# Patient Record
Sex: Female | Born: 2000 | Race: Black or African American | Hispanic: No | Marital: Single | Smoking: Never smoker
Health system: Southern US, Community
[De-identification: ages and names within clinical notes are randomized; demographics above are authoritative.]

## PROBLEM LIST (undated history)

## (undated) DIAGNOSIS — J45909 Unspecified asthma, uncomplicated: Secondary | ICD-10-CM

## (undated) HISTORY — DX: Unspecified asthma, uncomplicated: J45.909

---
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
## 2019-09-23 LAB — OB RESULTS CONSOLE HEPATITIS B SURFACE ANTIGEN: Hepatitis B Surface Ag: NEGATIVE

## 2019-09-23 LAB — OB RESULTS CONSOLE RUBELLA ANTIBODY, IGM: Rubella: IMMUNE

## 2019-09-23 LAB — OB RESULTS CONSOLE GC/CHLAMYDIA
Chlamydia: NEGATIVE
Gonorrhea: NEGATIVE

## 2019-09-23 LAB — OB RESULTS CONSOLE HIV ANTIBODY (ROUTINE TESTING): HIV: NONREACTIVE

## 2019-09-23 LAB — OB RESULTS CONSOLE RPR: RPR: NONREACTIVE

## 2020-01-11 ENCOUNTER — Other Ambulatory Visit: Payer: Self-pay

## 2020-01-11 ENCOUNTER — Emergency Department
Admission: EM | Admit: 2020-01-11 | Discharge: 2020-01-11 | Disposition: A | Discharge status: Home / Self Care | Payer: Medicaid Other | Attending: Emergency Medicine | Admitting: Emergency Medicine

## 2020-01-11 ENCOUNTER — Encounter: Payer: Self-pay | Admitting: Emergency Medicine

## 2020-01-11 DIAGNOSIS — Z3A27 27 weeks gestation of pregnancy: Secondary | ICD-10-CM | POA: Insufficient documentation

## 2020-01-11 DIAGNOSIS — J069 Acute upper respiratory infection, unspecified: Secondary | ICD-10-CM

## 2020-01-11 DIAGNOSIS — Z20822 Contact with and (suspected) exposure to covid-19: Secondary | ICD-10-CM | POA: Insufficient documentation

## 2020-01-11 DIAGNOSIS — O99513 Diseases of the respiratory system complicating pregnancy, third trimester: Secondary | ICD-10-CM | POA: Insufficient documentation

## 2020-01-11 LAB — GROUP A STREP BY PCR: Group A Strep by PCR: NOT DETECTED

## 2020-01-11 LAB — RESP PANEL BY RT-PCR (FLU A&B, COVID) ARPGX2
Influenza A by PCR: NEGATIVE
Influenza B by PCR: NEGATIVE
SARS Coronavirus 2 by RT PCR: NEGATIVE

## 2020-01-11 NOTE — ED Provider Notes (Signed)
[facility] Emergency Department Provider Note  ____________________________________________   First MD Initiated Contact with Patient 01/11/20 [phone]     (approximate)  I have reviewed the triage vital signs and the nursing notes.   HISTORY  Chief Complaint Nasal Congestion, Sore Throat, and Cough   HPI Janet Ryan is a 19 y.o. female without significant past medical history who is approximately [redacted] weeks pregnant with otherwise unremarkable pregnancy who presents for assessment approximately 3 days of some congestion nonproductive cough and sore throat.  Patient denies any fevers, chest pain, shortness of breath, headache, earache, abdominal pain, back pain, diarrhea, vaginal bleeding or discharge, contractions, rash or extremity pain.  No personal episodes.  No clearly feeding aggravating factors.  Patient denies tobacco abuse or EtOH or illicit drug use.         History reviewed. No pertinent past medical history.  There are no problems to display for this patient.   History reviewed. No pertinent surgical history.  Prior to Admission medications   Not on File    Allergies Nasonex [mometasone]  History reviewed. No pertinent family history.  Social History Social History   Tobacco Use  . Smoking status: Never Smoker  . Smokeless tobacco: Never Used  Substance Use Topics  . Alcohol use: Never  . Drug use: Never    Review of Systems  Review of Systems  Constitutional: Negative for chills and fever.  HENT: Positive for congestion and sore throat.   Eyes: Negative for pain.  Respiratory: Positive for cough. Negative for stridor.   Cardiovascular: Negative for chest pain.  Gastrointestinal: Negative for vomiting.  Skin: Negative for rash.  Neurological: Negative for seizures, loss of consciousness and headaches.  Psychiatric/Behavioral: Negative for suicidal ideas.  All other systems reviewed and are negative.      ____________________________________________   PHYSICAL EXAM:  VITAL SIGNS: ED Triage Vitals [01/11/20 0434]  Enc Vitals Group     BP      Pulse      Resp      Temp      Temp src      SpO2      Weight 160 lb (72.6 kg)     Height 5\' 7"  (1.702 m)     Head Circumference      Peak Flow      Pain Score 8     Pain Loc      Pain Edu?      Excl. in GC?    Vitals:   01/11/20 0744  BP: (!) 108/59  Pulse: (!) 108  Resp: 20  Temp: 98.9 F (37.2 C)  SpO2: 99%   Physical Exam Vitals and nursing note reviewed.  Constitutional:      General: She is not in acute distress.    Appearance: She is well-developed.  HENT:     Head: Normocephalic and atraumatic.     Right Ear: External ear normal.     Left Ear: External ear normal.     Nose: Nose normal.     Mouth/Throat:     Mouth: Mucous membranes are moist.  Eyes:     Conjunctiva/sclera: Conjunctivae normal.  Cardiovascular:     Rate and Rhythm: Normal rate and regular rhythm.     Heart sounds: No murmur heard.   Pulmonary:     Effort: Pulmonary effort is normal. No respiratory distress.     Breath sounds: Normal breath sounds.  Abdominal:     Palpations: Abdomen is soft.  Tenderness: There is no abdominal tenderness.  Musculoskeletal:     Cervical back: Neck supple.     Right lower leg: No edema.     Left lower leg: No edema.  Skin:    General: Skin is warm and dry.     Capillary Refill: Capillary refill takes less than 2 seconds.  Neurological:     Mental Status: She is alert and oriented to person, place, and time.  Psychiatric:        Mood and Affect: Mood normal.     Abdomen is gravid.  No tonsillar exudate or uvular deviation.  Patient is Fornage motion of her neck.  No significant submandibular submental or preauricular lymphadenopathy. ____________________________________________   LABS (all labs ordered are listed, but only abnormal results are displayed)  Labs Reviewed  RESP PANEL BY RT-PCR  (FLU A&B, COVID) ARPGX2  GROUP A STREP BY PCR   ____________________________________________   ____________________________________________   PROCEDURES  Procedure(s) performed (including Critical Care):  Procedures   ____________________________________________   INITIAL IMPRESSION / ASSESSMENT AND PLAN / ED COURSE        Patient presents above to history exam for assessment of cough congestion and sore throat over the last 3 days.  Patient is afebrile hemodynamically stable on arrival.  There is no fever on history or on arrival.  Impression is likely viral URI and bronchitis.  No fever or abnormal breath sounds to suggest pneumonia.  No findings on exam to suggest PTA or retropharyngeal abscess or other deep space infection of the head or neck.  Do not believe patient is septic.  Patient otherwise denies any acute complaints and is tolerating p.o.  Advised her to take Tylenol.  Rapid strep and Covid negative.  Discharge stable condition.  Strict precautions advised discussed.    __________________________________________   FINAL CLINICAL IMPRESSION(S) / ED DIAGNOSES  Final diagnoses:  Viral upper respiratory tract infection    Medications - No data to display   ED Discharge Orders    None       Note:  This document was prepared using Dragon voice recognition software and may include unintentional dictation errors.  Gilles Chiquito, MD 01/11/20 [phone]

## 2020-01-11 NOTE — ED Notes (Signed)
Pt presents to the ED for productive cough, nasal congestion, and sore throat for the past 3 days. Lung sound clear. Pt is [redacted] weeks pregnant. Pt is A&Ox4 and NAD. Ambulatory to room.

## 2020-01-11 NOTE — ED Triage Notes (Signed)
Pt to ED from home c/o nasal congestion, cough, sore throat x3 days.  Pt denies fevers.  [redacted]wks pregnant without complications.  States has been around nephew who recently got over sinus infection. Otherwise chest rise even and unlabored, skin WNL, in NAD at this time.

## 2020-01-11 NOTE — ED Triage Notes (Signed)
EMS brings pt in from home for c/o nasal congestion & cough x 3 days; [redacted]wks pregnant

## 2020-01-27 ENCOUNTER — Encounter: Payer: Self-pay | Admitting: Student

## 2020-01-27 ENCOUNTER — Ambulatory Visit (INDEPENDENT_AMBULATORY_CARE_PROVIDER_SITE_OTHER): Payer: Medicaid Other | Admitting: Student

## 2020-01-27 ENCOUNTER — Other Ambulatory Visit: Payer: Self-pay

## 2020-01-27 ENCOUNTER — Other Ambulatory Visit (HOSPITAL_COMMUNITY)
Admission: RE | Admit: 2020-01-27 | Discharge: 2020-01-27 | Disposition: A | Discharge status: Home / Self Care | Payer: Medicaid Other | Source: Ambulatory Visit | Attending: Obstetrics & Gynecology | Admitting: Obstetrics & Gynecology

## 2020-01-27 VITALS — BP 113/74 | HR 96 | Wt 165.4 lb

## 2020-01-27 DIAGNOSIS — Z3A29 29 weeks gestation of pregnancy: Secondary | ICD-10-CM

## 2020-01-27 DIAGNOSIS — N898 Other specified noninflammatory disorders of vagina: Secondary | ICD-10-CM | POA: Insufficient documentation

## 2020-01-27 DIAGNOSIS — Z23 Encounter for immunization: Secondary | ICD-10-CM | POA: Diagnosis not present

## 2020-01-27 DIAGNOSIS — O26893 Other specified pregnancy related conditions, third trimester: Secondary | ICD-10-CM | POA: Diagnosis present

## 2020-01-27 DIAGNOSIS — Z3403 Encounter for supervision of normal first pregnancy, third trimester: Secondary | ICD-10-CM | POA: Insufficient documentation

## 2020-01-27 LAB — CBC
Hematocrit: 31 % — ABNORMAL LOW (ref 34.0–46.6)
Hemoglobin: 11 g/dL — ABNORMAL LOW (ref 11.1–15.9)
MCH: 31.9 pg (ref 26.6–33.0)
MCHC: 35.5 g/dL (ref 31.5–35.7)
MCV: 90 fL (ref 79–97)
Platelets: 264 10*3/uL (ref 150–450)
RBC: 3.45 x10E6/uL — ABNORMAL LOW (ref 3.77–5.28)
RDW: 11.8 % (ref 11.7–15.4)
WBC: 8.5 10*3/uL (ref 3.4–10.8)

## 2020-01-27 MED ORDER — TERCONAZOLE 0.4 % VA CREA: 1.0000 | TOPICAL_CREAM | Freq: Every day | VAGINAL | 0 refills | Status: DC

## 2020-01-27 NOTE — Progress Notes (Signed)
  Subjective:    Janet Ryan is being seen today for her first obstetrical visit.  This is not a planned pregnancy but was not prevented. . She is at [redacted]w[redacted]d gestation. Her obstetrical history is significant for minor asthma. No flare-ups in a few years. She has a inhaler but doesn't need it. . Relationship with FOB: not together.. Patient does intend to breast feed. Pregnancy history fully reviewed.  Patient reports yeast infection. She just finsiehd course of monistat but symptoms have returned and she feels itching, discomfort, swelling and clumpy discharge. .  Review of Systems:   Review of Systems  Constitutional: Negative.   HENT: Negative.   Respiratory: Negative.   Cardiovascular: Negative.   Gastrointestinal: Negative.   Genitourinary: Negative.     Objective:     BP 113/74   Pulse 96   Wt 165 lb 6.4 oz (75 kg)   BMI 25.91 kg/m  Physical Exam Constitutional:      Appearance: Normal appearance.  HENT:     Head: Normocephalic.  Cardiovascular:     Rate and Rhythm: Normal rate.     Pulses: Normal pulses.  Pulmonary:     Effort: Pulmonary effort is normal.  Genitourinary:    General: Normal vulva.     Vagina: Vaginal discharge present.     Comments: NEFG; white clumpy discharge in vagina; small ulceration on perineum, no blistering Neurological:     Mental Status: She is alert.     Maternal Exam:  Introitus: Normal vulva. Vagina is positive for vaginal discharge.       Assessment:    Pregnancy: G2P0010 Patient Active Problem List   Diagnosis Date Noted  . Encounter for supervision of normal first pregnancy in third trimester 01/27/2020       Plan:     Initial labs drawn. Prenatal vitamins. Problem list reviewed and updated. AFP3 discussed: not done. Role of ultrasound in pregnancy discussed; fetal survey: results reviewed. Amniocentesis discussed: not indicated. Follow up in 4 weeks. 50% of 30 min visit spent on counseling and coordination of  care.  -Pap was normal in 10/2019 -RX for Terazol given -Enrolled in [employer] -Welcomed patient to practice, discussed role of APPs, students -will collect HSV, although ulceration is small and may not be able sample due to lack of fluid  Charlesetta Garibaldi [facility] 01/27/2020

## 2020-01-28 LAB — CERVICOVAGINAL ANCILLARY ONLY
Bacterial Vaginitis (gardnerella): NEGATIVE
Candida Glabrata: NEGATIVE
Candida Vaginitis: POSITIVE — AB
Comment: NEGATIVE
Comment: NEGATIVE
Comment: NEGATIVE

## 2020-01-28 LAB — HIV ANTIBODY (ROUTINE TESTING W REFLEX): HIV Screen 4th Generation wRfx: NONREACTIVE

## 2020-01-28 LAB — GLUCOSE TOLERANCE, 2 HOURS W/ 1HR
Glucose, 1 hour: 95 mg/dL (ref 65–179)
Glucose, 2 hour: 87 mg/dL (ref 65–152)
Glucose, Fasting: 83 mg/dL (ref 65–91)

## 2020-01-29 LAB — HERPES SIMPLEX VIRUS CULTURE

## 2020-02-02 LAB — RPR: RPR Ser Ql: REACTIVE — AB

## 2020-02-02 LAB — RPR, QUANT+TP ABS (REFLEX)
Rapid Plasma Reagin, Quant: 1:1 {titer} — ABNORMAL HIGH
T Pallidum Abs: NONREACTIVE

## 2020-02-04 ENCOUNTER — Encounter: Payer: Self-pay | Admitting: Student

## 2020-02-04 DIAGNOSIS — R768 Other specified abnormal immunological findings in serum: Secondary | ICD-10-CM | POA: Insufficient documentation

## 2020-02-04 HISTORY — DX: Other specified abnormal immunological findings in serum: R76.8

## 2020-02-10 ENCOUNTER — Encounter (HOSPITAL_COMMUNITY): Payer: Self-pay | Admitting: Family Medicine

## 2020-02-10 ENCOUNTER — Other Ambulatory Visit: Payer: Self-pay

## 2020-02-10 ENCOUNTER — Ambulatory Visit (INDEPENDENT_AMBULATORY_CARE_PROVIDER_SITE_OTHER): Payer: Medicaid Other | Admitting: Family Medicine

## 2020-02-10 ENCOUNTER — Inpatient Hospital Stay (HOSPITAL_COMMUNITY)
Admission: AD | Admit: 2020-02-10 | Discharge: 2020-02-10 | Disposition: A | Discharge status: Home / Self Care | Payer: Medicaid Other | Attending: Family Medicine | Admitting: Family Medicine

## 2020-02-10 ENCOUNTER — Inpatient Hospital Stay (HOSPITAL_BASED_OUTPATIENT_CLINIC_OR_DEPARTMENT_OTHER): Payer: Medicaid Other

## 2020-02-10 DIAGNOSIS — R55 Syncope and collapse: Secondary | ICD-10-CM | POA: Diagnosis not present

## 2020-02-10 DIAGNOSIS — J45909 Unspecified asthma, uncomplicated: Secondary | ICD-10-CM

## 2020-02-10 DIAGNOSIS — O26893 Other specified pregnancy related conditions, third trimester: Secondary | ICD-10-CM | POA: Diagnosis not present

## 2020-02-10 DIAGNOSIS — Z7982 Long term (current) use of aspirin: Secondary | ICD-10-CM | POA: Diagnosis not present

## 2020-02-10 DIAGNOSIS — Z3A31 31 weeks gestation of pregnancy: Secondary | ICD-10-CM

## 2020-02-10 DIAGNOSIS — R42 Dizziness and giddiness: Secondary | ICD-10-CM | POA: Diagnosis present

## 2020-02-10 DIAGNOSIS — O99519 Diseases of the respiratory system complicating pregnancy, unspecified trimester: Secondary | ICD-10-CM

## 2020-02-10 DIAGNOSIS — O99891 Other specified diseases and conditions complicating pregnancy: Secondary | ICD-10-CM | POA: Diagnosis not present

## 2020-02-10 DIAGNOSIS — Z3403 Encounter for supervision of normal first pregnancy, third trimester: Secondary | ICD-10-CM

## 2020-02-10 LAB — CBC
HCT: 32.6 % — ABNORMAL LOW (ref 36.0–46.0)
Hemoglobin: 11.1 g/dL — ABNORMAL LOW (ref 12.0–15.0)
MCH: 31.3 pg (ref 26.0–34.0)
MCHC: 34 g/dL (ref 30.0–36.0)
MCV: 91.8 fL (ref 80.0–100.0)
Platelets: 225 10*3/uL (ref 150–400)
RBC: 3.55 MIL/uL — ABNORMAL LOW (ref 3.87–5.11)
RDW: 12.3 % (ref 11.5–15.5)
WBC: 9.8 10*3/uL (ref 4.0–10.5)
nRBC: 0 % (ref 0.0–0.2)

## 2020-02-10 LAB — COMPREHENSIVE METABOLIC PANEL
ALT: 17 U/L (ref 0–44)
AST: 25 U/L (ref 15–41)
Albumin: 3.4 g/dL — ABNORMAL LOW (ref 3.5–5.0)
Alkaline Phosphatase: 52 U/L (ref 38–126)
Anion gap: 11 (ref 5–15)
BUN: 6 mg/dL (ref 6–20)
CO2: 21 mmol/L — ABNORMAL LOW (ref 22–32)
Calcium: 10.1 mg/dL (ref 8.9–10.3)
Chloride: 103 mmol/L (ref 98–111)
Creatinine, Ser: 0.69 mg/dL (ref 0.44–1.00)
GFR, Estimated: >60 mL/min (ref >60)
Glucose, Bld: 72 mg/dL (ref 70–99)
Potassium: 3.8 mmol/L (ref 3.5–5.1)
Sodium: 135 mmol/L (ref 135–145)
Total Bilirubin: 0.1 mg/dL — ABNORMAL LOW (ref 0.3–1.2)
Total Protein: 7.3 g/dL (ref 6.5–8.1)

## 2020-02-10 LAB — URINALYSIS, ROUTINE W REFLEX MICROSCOPIC
Bilirubin Urine: NEGATIVE
Glucose, UA: NEGATIVE mg/dL
Hgb urine dipstick: NEGATIVE
Ketones, ur: NEGATIVE mg/dL
Nitrite: NEGATIVE
Protein, ur: 30 mg/dL — AB
Specific Gravity, Urine: 1.014 (ref 1.005–1.030)
pH: 8 (ref 5.0–8.0)

## 2020-02-10 LAB — ECHOCARDIOGRAM COMPLETE
Area-P 1/2: 3.2 cm2
S' Lateral: 2.8 cm

## 2020-02-10 MED ORDER — LACTATED RINGERS IV BOLUS
1000.0000 mL | Freq: Once | INTRAVENOUS | Status: AC
  Administered 2020-02-10: 16:00:00 1000 mL via INTRAVENOUS

## 2020-02-10 NOTE — Progress Notes (Signed)
Echocardiogram 2D Echocardiogram has been performed.  Janet Ryan Janet Ryan 02/10/2020, 4:50 PM

## 2020-02-10 NOTE — Progress Notes (Signed)
When going out to call pt to room, she stated she was feeling dizzy. PT became very diaphoresis and sat her in chair, breathing became rapid. Dr Alvester Morin aware and at pt's side.   BP taken 62/37, then 78/42, pt then became more aware again.  O2 sat 99%  BP then 98/60 HR 104, 95/62  Pt then stabilized and moved to room via wheel chair, FHR assessed 165 via doppler.   Rechecked BP and is was 71/62 HR 121  1402-72/40 1405-73/43 HR 114  911 called   1410-68/39   1413-119/77 FHR 128  1417-83/39 HR 105

## 2020-02-10 NOTE — MAU Provider Note (Signed)
History     [id number]  Arrival date and time: 02/10/20 1516    Chief Complaint  Patient presents with  . dizzy     HPI Janet Ryan is a 19 y.o. at [redacted]w[redacted]d by 1st trimester [place] with unremarkable PMHx, who presents for syncope.   Patient presented to [facility] office earlier today for routine prenatal care and had syncopal event in the lobby EMS was called and she was transported to the MAU Per signout from Dr. Alvester Morin patient has been reporting some dizziness that began around 27-28 weeks, and today had BP of 62/37 with some vagal symptoms (sweating, some confusion) Also tested for COVID yesterday due to general concern over rising cases but no exposures  On my hx reports went for 31wk visit earlier today Was feeling dizzy earlier in the day, got worse on arrival After she sat down she felt very very dizzy and got very sweaty Took her BP and was very low Wheeled her to the back and when she got to an exam table she was laid back and felt even more dizzy and blood pressure got low again Denies chest pain during these events but did feel shortness of breath Does not currently feel short of breath Did not lose consciousness at any point No bowel or bladder loss of control No personal or family hx of seizures No swelling in legs Non smoker No personal or family hx of blood clots No family hx of heart problems besides HTN that she knows of No family hx of sudden death at a young age Has had dizziness intermittently throughout pregnancy  Denies vaginal bleeding or loss of fluid Normal fetal movement No contractions      OB History    Gravida  2   Para      Term      Preterm      AB  1   Living        SAB  1   IAB      Ectopic      Multiple      Live Births              Past Medical History:  Diagnosis Date  . Asthma     Past Surgical History:  Procedure Laterality Date  . WISDOM TOOTH EXTRACTION  2016    Family History  Problem  Relation Age of Onset  . Hypertension Mother   . Hypertension Maternal Grandmother     Social History   Socioeconomic History  . Marital status: Single    Spouse name: Not on file  . Number of children: Not on file  . Years of education: Not on file  . Highest education level: Not on file  Occupational History  . Not on file  Tobacco Use  . Smoking status: Never Smoker  . Smokeless tobacco: Never Used  Vaping Use  . Vaping Use: Never used  Substance and Sexual Activity  . Alcohol use: Never  . Drug use: Never  . Sexual activity: Not Currently  Other Topics Concern  . Not on file  Social History Narrative  . Not on file   Social Determinants of Health   Financial Resource Strain: Not on file  Food Insecurity: Not on file  Transportation Needs: Not on file  Physical Activity: Not on file  Stress: Not on file  Social Connections: Not on file  Intimate Partner Violence: Not on file    Allergies  Allergen Reactions  . Nasonex [  Mometasone] Shortness Of Breath    No current facility-administered medications on file prior to encounter.   Current Outpatient Medications on File Prior to Encounter  Medication Sig Dispense Refill  . aspirin 81 MG chewable tablet Chew by mouth daily.    . Prenatal Vit-Fe Fumarate-FA (PRENATAL MULTIVITAMIN) TABS tablet Take 1 tablet by mouth daily at 12 noon.    Marland Kitchen terconazole (TERAZOL 7) 0.4 % vaginal cream Place 1 applicator vaginally at bedtime. 45 g 0     ROS Pertinent positives and negative per HPI, all others reviewed and negative  Physical Exam   BP 111/70 (BP Location: Left Arm)   Pulse 90   Temp 99 F (37.2 C) (Oral)   Resp 16   SpO2 100%   Physical Exam Vitals reviewed.  Constitutional:      General: She is not in acute distress.    Appearance: She is well-developed and well-nourished. She is not diaphoretic.  Eyes:     General: No scleral icterus. Cardiovascular:     Rate and Rhythm: Normal rate and regular  rhythm.     Heart sounds: No murmur heard.   Pulmonary:     Effort: Pulmonary effort is normal. No respiratory distress.     Breath sounds: Normal breath sounds.  Abdominal:     General: There is no distension.     Palpations: Abdomen is soft.     Tenderness: There is no abdominal tenderness. There is no guarding or rebound.  Musculoskeletal:        General: No swelling or edema.  Skin:    General: Skin is warm and dry.  Neurological:     Mental Status: She is alert.     Coordination: Coordination normal.  Psychiatric:        Mood and Affect: Mood and affect normal.      Cervical Exam    Bedside Ultrasound Not done  My interpretation: n/a  FHT Baseline 140, moderate variability, +accels, no decels Toco: no contractions Cat: I  Labs Results for orders placed or performed during the hospital encounter of 02/10/20 (from the past 24 hour(s))  Urinalysis, Routine w reflex microscopic Urine, Clean Catch     Status: Abnormal   Collection Time: 02/10/20  3:30 PM  Result Value Ref Range   Color, Urine YELLOW YELLOW   APPearance CLOUDY (A) CLEAR   Specific Gravity, Urine 1.014 1.005 - 1.030   pH 8.0 5.0 - 8.0   Glucose, UA NEGATIVE NEGATIVE mg/dL   Hgb urine dipstick NEGATIVE NEGATIVE   Bilirubin Urine NEGATIVE NEGATIVE   Ketones, ur NEGATIVE NEGATIVE mg/dL   Protein, ur 30 (A) NEGATIVE mg/dL   Nitrite NEGATIVE NEGATIVE   Leukocytes,Ua LARGE (A) NEGATIVE   RBC / HPF 0-5 0 - 5 RBC/hpf   WBC, UA 11-20 0 - 5 WBC/hpf   Bacteria, UA MANY (A) NONE SEEN   Squamous Epithelial / LPF 11-20 0 - 5   Mucus PRESENT   CBC     Status: Abnormal   Collection Time: 02/10/20  4:12 PM  Result Value Ref Range   WBC 9.8 4.0 - 10.5 K/uL   RBC 3.55 (L) 3.87 - 5.11 MIL/uL   Hemoglobin 11.1 (L) 12.0 - 15.0 g/dL   HCT 58.8 (L) 50.2 - 77.4 %   MCV 91.8 80.0 - 100.0 fL   MCH 31.3 26.0 - 34.0 pg   MCHC 34.0 30.0 - 36.0 g/dL   RDW 12.8 78.6 - 76.7 %   Platelets 225  150 - 400 K/uL   nRBC  0.0 0.0 - 0.2 %  Comprehensive metabolic panel     Status: Abnormal   Collection Time: 02/10/20  4:12 PM  Result Value Ref Range   Sodium 135 135 - 145 mmol/L   Potassium 3.8 3.5 - 5.1 mmol/L   Chloride 103 98 - 111 mmol/L   CO2 21 (L) 22 - 32 mmol/L   Glucose, Bld 72 70 - 99 mg/dL   BUN 6 6 - 20 mg/dL   Creatinine, Ser 1.610.69 0.44 - 1.00 mg/dL   Calcium 09.610.1 8.9 - 04.510.3 mg/dL   Total Protein 7.3 6.5 - 8.1 g/dL   Albumin 3.4 (L) 3.5 - 5.0 g/dL   AST 25 15 - 41 U/L   ALT 17 0 - 44 U/L   Alkaline Phosphatase 52 38 - 126 U/L   Total Bilirubin 0.1 (L) 0.3 - 1.2 mg/dL   GFR, Estimated >40>60 >98>60 mL/min   Anion gap 11 5 - 15    Imaging ECHOCARDIOGRAM COMPLETE  Result Date: 02/10/2020    ECHOCARDIOGRAM REPORT   Patient Name:   Susanne BordersLARONDA Sahlin Date of Exam: 02/10/2020 Medical Rec #:  [id number]      Height:       67.0 in Accession #:    [id number]     Weight:       165.4 lb Date of Birth:  Feb 03, 2001      BSA:          1.865 m Patient Age:    19 years       BP:           111/70 mmHg Patient Gender: F              HR:           76 bpm. Exam Location:  Inpatient Procedure: 2D Echo, Color Doppler and Cardiac Doppler STAT ECHO Indications:    R55 Syncope  History:        Patient has no prior history of Echocardiogram examinations. [redacted]                 weeks pregnant at time of study.  Sonographer:    Irving BurtonEmily Senior RDCS Referring Phys: [id number] Jennalynn Rivard M Taygan Connell IMPRESSIONS  1. Left ventricular ejection fraction, by estimation, is 60 to 65%. The left ventricle has normal function. The left ventricle has no regional wall motion abnormalities. Left ventricular diastolic parameters were normal.  2. Right ventricular systolic function is normal. The right ventricular size is normal.  3. The mitral valve is normal in structure. No evidence of mitral valve regurgitation. No evidence of mitral stenosis.  4. The aortic valve is normal in structure. Aortic valve regurgitation is not visualized. No aortic stenosis is  present.  5. The inferior vena cava is normal in size with greater than 50% respiratory variability, suggesting right atrial pressure of 3 mmHg. FINDINGS  Left Ventricle: Left ventricular ejection fraction, by estimation, is 60 to 65%. The left ventricle has normal function. The left ventricle has no regional wall motion abnormalities. The left ventricular internal cavity size was normal in size. There is  no left ventricular hypertrophy. Left ventricular diastolic parameters were normal. Right Ventricle: The right ventricular size is normal. No increase in right ventricular wall thickness. Right ventricular systolic function is normal. Left Atrium: Left atrial size was normal in size. Right Atrium: Right atrial size was normal in size. Pericardium: There is no evidence of pericardial effusion. Mitral Valve:  The mitral valve is normal in structure. No evidence of mitral valve regurgitation. No evidence of mitral valve stenosis. Tricuspid Valve: The tricuspid valve is normal in structure. Tricuspid valve regurgitation is not demonstrated. No evidence of tricuspid stenosis. Aortic Valve: The aortic valve is normal in structure. Aortic valve regurgitation is not visualized. No aortic stenosis is present. Pulmonic Valve: The pulmonic valve was normal in structure. Pulmonic valve regurgitation is not visualized. No evidence of pulmonic stenosis. Aorta: The aortic root is normal in size and structure. Venous: The inferior vena cava is normal in size with greater than 50% respiratory variability, suggesting right atrial pressure of 3 mmHg. IAS/Shunts: No atrial level shunt detected by color flow Doppler.  LEFT VENTRICLE PLAX 2D LVIDd:         4.20 cm LVIDs:         2.80 cm LV PW:         0.90 cm LV IVS:        0.90 cm LVOT diam:     1.90 cm LV SV:         64 LV SV Index:   35 LVOT Area:     2.84 cm  RIGHT VENTRICLE RV S prime:     12.50 cm/s TAPSE (M-mode): 2.2 cm LEFT ATRIUM             Index       RIGHT ATRIUM            Index LA diam:        3.40 cm 1.82 cm/m  RA Area:     16.20 cm LA Vol (A2C):   58.7 ml 31.47 ml/m RA Volume:   41.00 ml  21.98 ml/m LA Vol (A4C):   50.6 ml 27.13 ml/m LA Biplane Vol: 57.8 ml 30.99 ml/m  AORTIC VALVE LVOT Vmax:   112.00 cm/s LVOT Vmean:  71.800 cm/s LVOT VTI:    0.227 m  AORTA Ao Root diam: 2.60 cm Ao Asc diam:  2.70 cm MITRAL VALVE MV Area (PHT): 3.20 cm    SHUNTS MV Decel Time: 237 msec    Systemic VTI:  0.23 m MV E velocity: 99.00 cm/s  Systemic Diam: 1.90 cm MV A velocity: 75.20 cm/s MV E/A ratio:  1.32 Donato Schultz MD Electronically signed by Donato Schultz MD Signature Date/Time: 02/10/2020/4:53:17 PM    Final     MAU Course  Procedures  Lab Orders     Urinalysis, Routine w reflex microscopic Urine, Clean Catch     CBC     Comprehensive metabolic panel Meds ordered this encounter  Medications  . lactated ringers bolus 1,000 mL   Imaging Orders  No imaging studies ordered today    MDM moderate  Assessment and Plan  #Pre-syncope Patient presenting after episode of pre-syncope in the office, given 1L bolus of LR. Symptoms most c/w vagal response given associated dizziness and sweating, but given persistence of symptoms over months and profound hypotension workup initiated and has been totally reassuring. Orthostatic vital signs were negative (though had already started hydration at that point), CBC and CMP are unremarkable, ECG unremarkable, and TTE was normal. Reassured patient of findings and discussed importance of maintaining adequate hydration and nutrition to avoid further symptoms. If she has persistent symptoms despite these interventions then I recommended that she see a cardiologist for further workup.   #FWB FHT Cat I NST: Reactive  Venora Maples

## 2020-02-10 NOTE — Progress Notes (Signed)
   PRENATAL VISIT NOTE  Subjective:  Janet Ryan is a 19 y.o. G2P0010 at [redacted]w[redacted]d being seen today for ongoing prenatal care.  She is currently monitored for the following issues for this low-risk pregnancy and has Encounter for supervision of normal first pregnancy in third trimester; Biological false positive RPR test; and Asthma during pregnancy on their problem list.  Patient reports no complaints.  Contractions: Irritability. Vag. Bleeding: None.  Movement: Present. Denies leaking of fluid.   Dizziness-- first at 27-28 wk glucola but attributed to test at that time. Reports . Reports increasing presyncopal events in pregnancy-- having them daily now. Had witnessed presyncopal event at visit on 12/30 at [facility]. Patient had BP of 62/37 that returned to normal within 10 minutes but patient was disoriented during event and became very sweaty. Describes the events has feeling dizziness and having SOB.  Reports any exertion will  trigger the events, especially climbing stairs. .  Desire work up for arrhthymias ASAP. Denies family history of sudden death or cardiac conditions. Has family history of severe preeclampsia.  Tested for covid yesterday- denies close contacts, known exposure. Tested due to concern over rising cases.   The following portions of the patient's history were reviewed and updated as appropriate: allergies, current medications, past family history, past medical history, past social history, past surgical history and problem list.   Objective:  There were no vitals filed for this visit.  Fetal Status: Fetal Heart Rate (bpm): 165   Movement: Present     General:  Alert, oriented and cooperative. Patient is in no acute distress.  Skin: Skin is warm and dry. No rash noted.   Cardiovascular: Normal heart rate noted  Respiratory: Normal respiratory effort, no problems with respiration noted  Abdomen: Soft, gravid, appropriate for gestational age.  Pain/Pressure: Present      Pelvic: Cervical exam deferred        Extremities: Normal range of motion.     Mental Status: Normal mood and affect. Normal behavior. Normal judgment and thought content.   Assessment and Plan:  Pregnancy: G2P0010 at [redacted]w[redacted]d  SENT TO MAU due to vital sign abnormalities and repeated pre-syncopal/dizziness events in the office. Unable to drive due to frequency of witnessed events in the office. 911 was called for transport to [facility].  Pulse ox 98-99% throughout event.   FHR was 150-160 throughout event as well.   Preterm labor symptoms and general obstetric precautions including but not limited to vaginal bleeding, contractions, leaking of fluid and fetal movement were reviewed in detail with the patient. Please refer to After Visit Summary for other counseling recommendations.   No follow-ups on file.  Future Appointments  Date Time Provider Department Center  02/25/2020 10:15 AM [facility] NURSE [facility]  02/25/2020 10:30 AM [facility] US3 [facility]    Federico Flake, MD

## 2020-02-10 NOTE — MAU Note (Signed)
Pt reports feeling dizzy walking into her OBGYN visit. Pt reports sweating and when she was called she couldn't stand up she felt like she was fainting. Pt reports blood pressure dropping and was zoning out for a second. Pt reports it took 15 minutes for her to feel stable. Pt reports again feeling dizzy and so her office called EMS.    Denies vaginal bleeding, Denies LOF  Reports +FM

## 2020-02-12 NOTE — L&D Delivery Note (Addendum)
OB/GYN Faculty Practice Delivery Note  Janet Ryan is a 20 y.o. G2P0010 s/p vaginal delivery at [redacted]w[redacted]d. She was admitted for elective IOL for post dates.   ROM: 16h 31m with thin mec fluid GBS Status:  Negative/-- (02/03 1455) Maximum Maternal Temperature:  Temp (24hrs), Avg:98.3 F (36.8 C), Min:97.8 F (36.6 C), Max:98.8 F (37.1 C)  Labor Progress: . Patient arrived at 3.5cm dilation and was induced with foley bulb, cytotec and pitocin .   Delivery Date/Time: 04/11/2020 at 1849 Delivery: Called to room and patient was complete and pushing. Head delivered in LOA  position. No nuchal cord present=. Shoulder and body delivered in usual fashion. Infant with spontaneous cry, placed on mother's abdomen, dried and stimulated. Cord clamped x 2 after 1-minute delay, and cut by mothers sisters. Cord blood drawn. Placenta delivered spontaneously with gentle cord traction. Fundus firm with massage and Pitocin. Labia, perineum, vagina, and cervix inspected with 2nd degree perineal tear and left labial tear and left periurethral tear . Labial and periurethral tears hemostatic did not require repair. Second degree perineal lac repaired with 3-0 vicryl suture.   Placenta: spontaneous, complete, 3 vessel cord  Complications: None  Lacerations: 2nd degree sulcal tear, repaired. Left labial and paraurethral (hemostatic)  EBL: 300 mL Analgesia: Epidural    Infant: APGAR (1 MIN): 8   APGAR (5 MINS): 9   APGAR (10 MINS):    Weight: pending  Patient had late transfer of prenatal care, will need SW consult prior to DC.  Fetal right Pyeloectasis: peds notified  Asthma: avoid hemabate  Derrel Nip, MD  PGY-2, [facility]  04/11/2020 7:12 PM

## 2020-02-14 ENCOUNTER — Other Ambulatory Visit: Payer: Self-pay | Admitting: Family Medicine

## 2020-02-14 ENCOUNTER — Other Ambulatory Visit: Payer: Self-pay

## 2020-02-14 ENCOUNTER — Ambulatory Visit (INDEPENDENT_AMBULATORY_CARE_PROVIDER_SITE_OTHER): Payer: Medicaid Other | Admitting: *Deleted

## 2020-02-14 VITALS — BP 108/68 | HR 97

## 2020-02-14 DIAGNOSIS — Z3403 Encounter for supervision of normal first pregnancy, third trimester: Secondary | ICD-10-CM

## 2020-02-14 LAB — POCT URINALYSIS DIPSTICK: Blood, UA: NEGATIVE

## 2020-02-14 NOTE — Progress Notes (Signed)
Patient seen and assessed by nursing staff.  Agree with documentation and plan.  

## 2020-02-14 NOTE — Progress Notes (Signed)
Pt here to repeat UA from MAU visit. Denies any urinary symptoms.

## 2020-02-16 LAB — CULTURE, OB URINE

## 2020-02-16 LAB — URINE CULTURE, OB REFLEX

## 2020-02-17 ENCOUNTER — Encounter (HOSPITAL_COMMUNITY): Payer: Self-pay | Admitting: Family Medicine

## 2020-02-17 ENCOUNTER — Inpatient Hospital Stay (HOSPITAL_COMMUNITY)
Admission: AD | Admit: 2020-02-17 | Discharge: 2020-02-17 | Disposition: A | Discharge status: Home / Self Care | Payer: Medicaid Other | Attending: Family Medicine | Admitting: Family Medicine

## 2020-02-17 ENCOUNTER — Other Ambulatory Visit: Payer: Self-pay

## 2020-02-17 DIAGNOSIS — O4693 Antepartum hemorrhage, unspecified, third trimester: Secondary | ICD-10-CM | POA: Diagnosis not present

## 2020-02-17 DIAGNOSIS — O98813 Other maternal infectious and parasitic diseases complicating pregnancy, third trimester: Secondary | ICD-10-CM | POA: Diagnosis present

## 2020-02-17 DIAGNOSIS — B373 Candidiasis of vulva and vagina: Secondary | ICD-10-CM | POA: Diagnosis not present

## 2020-02-17 DIAGNOSIS — Z79899 Other long term (current) drug therapy: Secondary | ICD-10-CM | POA: Insufficient documentation

## 2020-02-17 DIAGNOSIS — Z3A32 32 weeks gestation of pregnancy: Secondary | ICD-10-CM | POA: Diagnosis not present

## 2020-02-17 DIAGNOSIS — B3731 Acute candidiasis of vulva and vagina: Secondary | ICD-10-CM

## 2020-02-17 LAB — URINALYSIS, ROUTINE W REFLEX MICROSCOPIC
Bilirubin Urine: NEGATIVE
Glucose, UA: NEGATIVE mg/dL
Ketones, ur: NEGATIVE mg/dL
Nitrite: NEGATIVE
Protein, ur: NEGATIVE mg/dL
RBC / HPF: >50 RBC/hpf — ABNORMAL HIGH (ref 0–5)
Specific Gravity, Urine: 1.004 — ABNORMAL LOW (ref 1.005–1.030)
pH: 7 (ref 5.0–8.0)

## 2020-02-17 LAB — WET PREP, GENITAL
Sperm: NONE SEEN
Trich, Wet Prep: NONE SEEN

## 2020-02-17 MED ORDER — TERCONAZOLE 0.4 % VA CREA: 1.0000 | TOPICAL_CREAM | Freq: Every day | VAGINAL | 0 refills | Status: DC

## 2020-02-17 NOTE — MAU Provider Note (Signed)
History     CSN: [id number]  Arrival date and time: 02/17/20 1935   Event Date/Time   First Provider Initiated Contact with Patient 02/17/20 2147      Chief Complaint  Patient presents with  . Vaginal Bleeding   HPI Janet Ryan is a 20 y.o. G2P0010 at [redacted]w[redacted]d who presents with vaginal bleeding. She states she woke up from a nap this evening and saw bleeding when she went to the bathroom. She states she took a shower and kept seeing the bleeding so she came in. She reports some cramping last night but none today. She denies any discharge or leaking of fluid. She reports normal fetal movement.   OB History    Gravida  2   Para      Term      Preterm      AB  1   Living        SAB  1   IAB      Ectopic      Multiple      Live Births              Past Medical History:  Diagnosis Date  . Asthma     Past Surgical History:  Procedure Laterality Date  . WISDOM TOOTH EXTRACTION  2016    Family History  Problem Relation Age of Onset  . Hypertension Mother   . Hypertension Maternal Grandmother     Social History   Tobacco Use  . Smoking status: Never Smoker  . Smokeless tobacco: Never Used  Vaping Use  . Vaping Use: Never used  Substance Use Topics  . Alcohol use: Never  . Drug use: Never    Allergies:  Allergies  Allergen Reactions  . Nasonex [Mometasone] Shortness Of Breath    Medications Prior to Admission  Medication Sig Dispense Refill Last Dose  . aspirin 81 MG chewable tablet Chew by mouth daily.     . Prenatal Vit-Fe Fumarate-FA (PRENATAL MULTIVITAMIN) TABS tablet Take 1 tablet by mouth daily at 12 noon.     Marland Kitchen terconazole (TERAZOL 7) 0.4 % vaginal cream Place 1 applicator vaginally at bedtime. 45 g 0     Review of Systems  Constitutional: Negative.  Negative for fatigue and fever.  HENT: Negative.   Respiratory: Negative.  Negative for shortness of breath.   Cardiovascular: Negative.  Negative for chest pain.  Gastrointestinal:  Negative.  Negative for abdominal pain, constipation, diarrhea, nausea and vomiting.  Genitourinary: Positive for vaginal bleeding. Negative for dysuria and vaginal discharge.  Neurological: Negative.  Negative for dizziness and headaches.   Physical Exam   Blood pressure 119/71, pulse 99, temperature 99 F (37.2 C), resp. rate 16, height 5\' 8"  (1.727 m), weight 78.9 kg.  Physical Exam Vitals and nursing note reviewed.  Constitutional:      General: She is not in acute distress.    Appearance: She is well-developed and well-nourished.  HENT:     Head: Normocephalic.  Eyes:     Pupils: Pupils are equal, round, and reactive to light.  Cardiovascular:     Rate and Rhythm: Normal rate and regular rhythm.     Heart sounds: Normal heart sounds.  Pulmonary:     Effort: Pulmonary effort is normal. No respiratory distress.     Breath sounds: Normal breath sounds.  Abdominal:     General: Bowel sounds are normal. There is no distension.     Palpations: Abdomen is soft.  Tenderness: There is no abdominal tenderness.  Genitourinary:    Comments: SSE: copious thick white clumpy discharge adherent to vaginal walls, no blood on exam Skin:    General: Skin is warm and dry.  Neurological:     Mental Status: She is alert and oriented to person, place, and time.  Psychiatric:        Mood and Affect: Mood and affect normal.        Behavior: Behavior normal.        Thought Content: Thought content normal.        Judgment: Judgment normal.     Dilation: Closed Exam by:: Ma Hillock CNM  Fetal Tracing:  Baseline: 140 Variability: moderate Accels: 15x15 Decels: none  Toco: none   MAU Course  Procedures Results for orders placed or performed during the hospital encounter of 02/17/20 (from the past 24 hour(s))  Urinalysis, Routine w reflex microscopic Urine, Clean Catch     Status: Abnormal   Collection Time: 02/17/20  8:12 PM  Result Value Ref Range   Color, Urine YELLOW YELLOW    APPearance CLOUDY (A) CLEAR   Specific Gravity, Urine 1.004 (L) 1.005 - 1.030   pH 7.0 5.0 - 8.0   Glucose, UA NEGATIVE NEGATIVE mg/dL   Hgb urine dipstick SMALL (A) NEGATIVE   Bilirubin Urine NEGATIVE NEGATIVE   Ketones, ur NEGATIVE NEGATIVE mg/dL   Protein, ur NEGATIVE NEGATIVE mg/dL   Nitrite NEGATIVE NEGATIVE   Leukocytes,Ua LARGE (A) NEGATIVE   RBC / HPF >50 (H) 0 - 5 RBC/hpf   WBC, UA 21-50 0 - 5 WBC/hpf   Bacteria, UA FEW (A) NONE SEEN   Squamous Epithelial / LPF 21-50 0 - 5   Budding Yeast PRESENT   Wet prep, genital     Status: Abnormal   Collection Time: 02/17/20  9:59 PM   Specimen: Cervix  Result Value Ref Range   Yeast Wet Prep HPF POC PRESENT (A) NONE SEEN   Trich, Wet Prep NONE SEEN NONE SEEN   Clue Cells Wet Prep HPF POC PRESENT (A) NONE SEEN   WBC, Wet Prep HPF POC MANY (A) NONE SEEN   Sperm NONE SEEN    MDM UA, UC Wet prep and gc/chlamydia  Assessment and Plan   1. Candidiasis of vagina during pregnancy   2. [redacted] weeks gestation of pregnancy    -Discharge home in stable condition -Rx for terazol sent to patient's pharmacy. -Third trimester precautions discussed -Patient advised to follow-up with OB as scheduled for prenatal care -Patient may return to MAU as needed or if her condition were to change or worsen   Rolm Bookbinder CNM 02/17/2020, 9:48 PM

## 2020-02-17 NOTE — MAU Note (Addendum)
Went to [facility] and saw red/pink/orange discharge on tissue. Had pain in upper abd tonight when urinated and concerned her. Has had occ ctx in past but this pain was different. Reports good FM

## 2020-02-17 NOTE — Discharge Instructions (Signed)
Safe Medications in Pregnancy   Acne: Benzoyl Peroxide Salicylic Acid  Backache/Headache: Tylenol: 2 regular strength every 4 hours OR              2 Extra strength every 6 hours  Colds/Coughs/Allergies: Benadryl (alcohol free) 25 mg every 6 hours as needed Breath right strips Claritin Cepacol throat lozenges Chloraseptic throat spray Cold-Eeze- up to three times per day Cough drops, alcohol free Flonase (by prescription only) Guaifenesin Mucinex Robitussin DM (plain only, alcohol free) Saline nasal spray/drops Sudafed (pseudoephedrine) & Actifed ** use only after [redacted] weeks gestation and if you do not have high blood pressure Tylenol Vicks Vaporub Zinc lozenges Zyrtec   Constipation: Colace Ducolax suppositories Fleet enema Glycerin suppositories Metamucil Milk of magnesia Miralax Senokot Smooth move tea  Diarrhea: Kaopectate Imodium A-D  *NO pepto Bismol  Hemorrhoids: Anusol Anusol HC Preparation H Tucks  Indigestion: Tums Maalox Mylanta Zantac  Pepcid  Insomnia: Benadryl (alcohol free) 25mg  every 6 hours as needed Tylenol PM Unisom, no Gelcaps  Leg Cramps: Tums MagGel  Nausea/Vomiting:  Bonine Dramamine Emetrol Ginger extract Sea bands Meclizine  Nausea medication to take during pregnancy:  Unisom (doxylamine succinate 25 mg tablets) Take one tablet daily at bedtime. If symptoms are not adequately controlled, the dose can be increased to a maximum recommended dose of two tablets daily (1/2 tablet in the morning, 1/2 tablet mid-afternoon and one at bedtime). Vitamin B6 100mg  tablets. Take one tablet twice a day (up to 200 mg per day).  Skin Rashes: Aveeno products Benadryl cream or 25mg  every 6 hours as needed Calamine Lotion 1% cortisone cream  Yeast infection: Gyne-lotrimin 7 Monistat 7   **If taking multiple medications, please check labels to avoid duplicating the same active ingredients **take medication as directed on  the label ** Do not exceed 4000 mg of tylenol in 24 hours **Do not take medications that contain aspirin or ibuprofen     Vaginal Yeast Infection, Adult  Vaginal yeast infection is a condition that causes vaginal discharge as well as soreness, swelling, and redness (inflammation) of the vagina. This is a common condition. Some women get this infection frequently. What are the causes? This condition is caused by a change in the normal balance of the yeast (candida) and bacteria that live in the vagina. This change causes an overgrowth of yeast, which causes the inflammation. What increases the risk? The condition is more likely to develop in women who:  Take antibiotic medicines.  Have diabetes.  Take birth control pills.  Are pregnant.  Douche often.  Have a weak body defense system (immune system).  Have been taking steroid medicines for a long time.  Frequently wear tight clothing. What are the signs or symptoms? Symptoms of this condition include:  White, thick, creamy vaginal discharge.  Swelling, itching, redness, and irritation of the vagina. The lips of the vagina (vulva) may be affected as well.  Pain or a burning feeling while urinating.  Pain during sex. How is this diagnosed? This condition is diagnosed based on:  Your medical history.  A physical exam.  A pelvic exam. Your health care provider will examine a sample of your vaginal discharge under a microscope. Your health care provider may send this sample for testing to confirm the diagnosis. How is this treated? This condition is treated with medicine. Medicines may be over-the-counter or prescription. You may be told to use one or more of the following:  Medicine that is taken by mouth (orally).  Medicine  that is applied as a cream (topically).  Medicine that is inserted directly into the vagina (suppository). Follow these instructions at home:  Lifestyle  Do not have sex until your health  care provider approves. Tell your sex partner that you have a yeast infection. That person should go to his or her health care provider and ask if they should also be treated.  Do not wear tight clothes, such as pantyhose or tight pants.  Wear breathable cotton underwear. General instructions  Take or apply over-the-counter and prescription medicines only as told by your health care provider.  Eat more yogurt. This may help to keep your yeast infection from returning.  Do not use tampons until your health care provider approves.  Try taking a sitz bath to help with discomfort. This is a warm water bath that is taken while you are sitting down. The water should only come up to your hips and should cover your buttocks. Do this 3-4 times per day or as told by your health care provider.  Do not douche.  If you have diabetes, keep your blood sugar levels under control.  Keep all follow-up visits as told by your health care provider. This is important. Contact a health care provider if:  You have a fever.  Your symptoms go away and then return.  Your symptoms do not get better with treatment.  Your symptoms get worse.  You have new symptoms.  You develop blisters in or around your vagina.  You have blood coming from your vagina and it is not your menstrual period.  You develop pain in your abdomen. Summary  Vaginal yeast infection is a condition that causes discharge as well as soreness, swelling, and redness (inflammation) of the vagina.  This condition is treated with medicine. Medicines may be over-the-counter or prescription.  Take or apply over-the-counter and prescription medicines only as told by your health care provider.  Do not douche. Do not have sex or use tampons until your health care provider approves.  Contact a health care provider if your symptoms do not get better with treatment or your symptoms go away and then return. This information is not intended to  replace advice given to you by your health care provider. Make sure you discuss any questions you have with your health care provider. Document Revised: 08/28/2018 Document Reviewed: 06/16/2017 Elsevier Patient Education  2020 [employer].

## 2020-02-19 ENCOUNTER — Encounter (HOSPITAL_COMMUNITY): Payer: Self-pay | Admitting: Obstetrics and Gynecology

## 2020-02-19 ENCOUNTER — Other Ambulatory Visit: Payer: Self-pay

## 2020-02-19 ENCOUNTER — Inpatient Hospital Stay (HOSPITAL_COMMUNITY): Payer: Medicaid Other

## 2020-02-19 ENCOUNTER — Inpatient Hospital Stay (HOSPITAL_COMMUNITY)
Admission: AD | Admit: 2020-02-19 | Discharge: 2020-02-19 | Disposition: A | Discharge status: Home / Self Care | Payer: Medicaid Other | Attending: Family Medicine | Admitting: Family Medicine

## 2020-02-19 DIAGNOSIS — E876 Hypokalemia: Secondary | ICD-10-CM | POA: Diagnosis not present

## 2020-02-19 DIAGNOSIS — O99283 Endocrine, nutritional and metabolic diseases complicating pregnancy, third trimester: Secondary | ICD-10-CM | POA: Diagnosis not present

## 2020-02-19 DIAGNOSIS — O99891 Other specified diseases and conditions complicating pregnancy: Secondary | ICD-10-CM | POA: Diagnosis not present

## 2020-02-19 DIAGNOSIS — R55 Syncope and collapse: Secondary | ICD-10-CM | POA: Insufficient documentation

## 2020-02-19 DIAGNOSIS — D508 Other iron deficiency anemias: Secondary | ICD-10-CM | POA: Diagnosis not present

## 2020-02-19 DIAGNOSIS — Z3A32 32 weeks gestation of pregnancy: Secondary | ICD-10-CM | POA: Diagnosis not present

## 2020-02-19 DIAGNOSIS — O26893 Other specified pregnancy related conditions, third trimester: Secondary | ICD-10-CM | POA: Diagnosis not present

## 2020-02-19 DIAGNOSIS — O99013 Anemia complicating pregnancy, third trimester: Secondary | ICD-10-CM | POA: Insufficient documentation

## 2020-02-19 DIAGNOSIS — Z3689 Encounter for other specified antenatal screening: Secondary | ICD-10-CM

## 2020-02-19 LAB — COMPREHENSIVE METABOLIC PANEL
ALT: 13 U/L (ref 0–44)
AST: 20 U/L (ref 15–41)
Albumin: 2.8 g/dL — ABNORMAL LOW (ref 3.5–5.0)
Alkaline Phosphatase: 42 U/L (ref 38–126)
Anion gap: 11 (ref 5–15)
BUN: 7 mg/dL (ref 6–20)
CO2: 20 mmol/L — ABNORMAL LOW (ref 22–32)
Calcium: 8.6 mg/dL — ABNORMAL LOW (ref 8.9–10.3)
Chloride: 106 mmol/L (ref 98–111)
Creatinine, Ser: 0.84 mg/dL (ref 0.44–1.00)
GFR, Estimated: >60 mL/min (ref >60)
Glucose, Bld: 72 mg/dL (ref 70–99)
Potassium: 3.4 mmol/L — ABNORMAL LOW (ref 3.5–5.1)
Sodium: 137 mmol/L (ref 135–145)
Total Bilirubin: 0.4 mg/dL (ref 0.3–1.2)
Total Protein: 6.1 g/dL — ABNORMAL LOW (ref 6.5–8.1)

## 2020-02-19 LAB — CULTURE, OB URINE

## 2020-02-19 LAB — CBC
HCT: 28.7 % — ABNORMAL LOW (ref 36.0–46.0)
Hemoglobin: 9.7 g/dL — ABNORMAL LOW (ref 12.0–15.0)
MCH: 31.9 pg (ref 26.0–34.0)
MCHC: 33.8 g/dL (ref 30.0–36.0)
MCV: 94.4 fL (ref 80.0–100.0)
Platelets: 196 10*3/uL (ref 150–400)
RBC: 3.04 MIL/uL — ABNORMAL LOW (ref 3.87–5.11)
RDW: 12.9 % (ref 11.5–15.5)
WBC: 9.5 10*3/uL (ref 4.0–10.5)
nRBC: 0 % (ref 0.0–0.2)

## 2020-02-19 LAB — TROPONIN I (HIGH SENSITIVITY): Troponin I (High Sensitivity): 4 ng/L (ref <18)

## 2020-02-19 MED ORDER — FERROUS GLUCONATE 324 (38 FE) MG PO TABS: 324.0000 mg | ORAL_TABLET | ORAL | 3 refills | Status: AC

## 2020-02-19 MED ORDER — MEDICAL COMPRESSION STOCKINGS MISC: 2.0000 | 0 refills | Status: DC | PRN

## 2020-02-19 MED ORDER — LACTATED RINGERS IV BOLUS
1000.0000 mL | Freq: Once | INTRAVENOUS | Status: AC
  Administered 2020-02-19: 1000 mL via INTRAVENOUS

## 2020-02-19 MED ORDER — POTASSIUM CHLORIDE ER 20 MEQ PO TBCR: 20.0000 meq | EXTENDED_RELEASE_TABLET | Freq: Two times a day (BID) | ORAL | 0 refills | Status: DC

## 2020-02-19 MED ORDER — ONDANSETRON 4 MG PO TBDP: 4.0000 mg | ORAL_TABLET | Freq: Four times a day (QID) | ORAL | 0 refills | Status: DC | PRN

## 2020-02-19 NOTE — MAU Provider Note (Addendum)
History     CSN: [id number]  Arrival date and time: 02/19/20 1454   Event Date/Time   First Provider Initiated Contact with Patient 02/19/20 1503      No chief complaint on file.  HPI This is a 20yo G2P0010 at [redacted]w[redacted]d who presents with syncopal episode prior to arrival. Witnessed by patient's sister. No seizure like activity. She was eating when she got nauseated and vomited. She felt mildly SOB, diaphoretic, so she got up to get some ice. Her vision closed in and she passed out. Had spontaneous recovery. Her sister called EMS and the patient was transported to the MAU. On EMS arrival, she was hypotensive - they started an IV and gave her a fluid bolus. She currently feels weak, tired.   Had a presyncopal episode with hypotension in the office on 12/30 - she was sent to the MAU for evaluation. She has been referred to Cardiology and has an appt on 1/13.   OB History    Gravida  2   Para      Term      Preterm      AB  1   Living        SAB  1   IAB      Ectopic      Multiple      Live Births              Past Medical History:  Diagnosis Date  . Asthma     Past Surgical History:  Procedure Laterality Date  . WISDOM TOOTH EXTRACTION  2016    Family History  Problem Relation Age of Onset  . Hypertension Mother   . Hypertension Maternal Grandmother     Social History   Tobacco Use  . Smoking status: Never Smoker  . Smokeless tobacco: Never Used  Vaping Use  . Vaping Use: Never used  Substance Use Topics  . Alcohol use: Never  . Drug use: Never    Allergies:  Allergies  Allergen Reactions  . Nasonex [Mometasone] Shortness Of Breath    Medications Prior to Admission  Medication Sig Dispense Refill Last Dose  . aspirin 81 MG chewable tablet Chew by mouth daily.   02/18/2020 at Unknown time  . Prenatal Vit-Fe Fumarate-FA (PRENATAL MULTIVITAMIN) TABS tablet Take 1 tablet by mouth daily at 12 noon.   02/18/2020 at Unknown time  . terconazole  (TERAZOL 7) 0.4 % vaginal cream Place 1 applicator vaginally at bedtime. 45 g 0 02/18/2020 at Unknown time    Review of Systems Physical Exam   Blood pressure 115/70, pulse 85, temperature 98.6 F (37 C), resp. rate 20, SpO2 100 %.   Physical Exam Vitals reviewed.  Constitutional:      Appearance: Normal appearance.  HENT:     Head: Normocephalic and atraumatic.  Cardiovascular:     Rate and Rhythm: Normal rate and regular rhythm.     Pulses: Normal pulses.     Heart sounds: Normal heart sounds. No murmur heard. No friction rub. No gallop.   Pulmonary:     Effort: Pulmonary effort is normal. No respiratory distress.     Breath sounds: No stridor. No wheezing or rhonchi.  Abdominal:     General: Abdomen is flat. There is no distension.     Palpations: Abdomen is soft. There is no mass.     Tenderness: There is no abdominal tenderness. There is no guarding or rebound.     Hernia: No hernia is present.  Neurological:     Mental Status: She is alert.  Psychiatric:        Mood and Affect: Mood normal.        Behavior: Behavior normal.        Thought Content: Thought content normal.        Judgment: Judgment normal.    Results for orders placed or performed during the hospital encounter of 02/19/20 (from the past 24 hour(s))  CBC     Status: Abnormal   Collection Time: 02/19/20  3:08 PM  Result Value Ref Range   WBC 9.5 4.0 - 10.5 K/uL   RBC 3.04 (L) 3.87 - 5.11 MIL/uL   Hemoglobin 9.7 (L) 12.0 - 15.0 g/dL   HCT 72.5 (L) 36.6 - 44.0 %   MCV 94.4 80.0 - 100.0 fL   MCH 31.9 26.0 - 34.0 pg   MCHC 33.8 30.0 - 36.0 g/dL   RDW 34.7 42.5 - 95.6 %   Platelets 196 150 - 400 K/uL   nRBC 0.0 0.0 - 0.2 %  Comprehensive metabolic panel     Status: Abnormal   Collection Time: 02/19/20  3:08 PM  Result Value Ref Range   Sodium 137 135 - 145 mmol/L   Potassium 3.4 (L) 3.5 - 5.1 mmol/L   Chloride 106 98 - 111 mmol/L   CO2 20 (L) 22 - 32 mmol/L   Glucose, Bld 72 70 - 99 mg/dL   BUN  7 6 - 20 mg/dL   Creatinine, Ser 3.87 0.44 - 1.00 mg/dL   Calcium 8.6 (L) 8.9 - 10.3 mg/dL   Total Protein 6.1 (L) 6.5 - 8.1 g/dL   Albumin 2.8 (L) 3.5 - 5.0 g/dL   AST 20 15 - 41 U/L   ALT 13 0 - 44 U/L   Alkaline Phosphatase 42 38 - 126 U/L   Total Bilirubin 0.4 0.3 - 1.2 mg/dL   GFR, Estimated >56 >43 mL/min   Anion gap 11 5 - 15  Troponin I (High Sensitivity)     Status: None   Collection Time: 02/19/20  3:08 PM  Result Value Ref Range   Troponin I (High Sensitivity) 4 <18 ng/L   DG CHEST PORT 1 VIEW  Result Date: 02/19/2020 CLINICAL DATA:  Syncope EXAM: PORTABLE CHEST 1 VIEW COMPARISON:  None. FINDINGS: The cardiomediastinal silhouette is normal in contour. No pleural effusion. No pneumothorax. No acute pleuroparenchymal abnormality. Visualized abdomen is unremarkable. No acute osseous abnormality noted. IMPRESSION: No acute cardiopulmonary abnormality. Electronically Signed   By: Meda Klinefelter MD   On: 02/19/2020 15:37     MAU Course  Procedures NST: baseline 150s. Moderate variability. + accel. No decel.  EKG: Sinus tachycardia. No acute ST changes.  MDM Reviewed Echocardiogram from 12/30 - LVEF normal. No wall motion abnormality. Troponin negative - likely not a pathogenic cardiac event.  Discussed patient with cardiology: Dr Duke Salvia. Recommended compression stockings and follow up in cards office.  Assessment and Plan     ICD-10-CM   1. [redacted] weeks gestation of pregnancy  Z3A.32   2. Syncope  R55 DG CHEST PORT 1 VIEW    DG CHEST PORT 1 VIEW  3. Vasovagal syncope  R55   4. Hypokalemia  E87.6   5. NST (non-stress test) reactive  Z36.89   6. Iron deficiency anemia secondary to inadequate dietary iron intake  D50.8    Replace potassium EKG normal for pregnancy Neg trop, normal echo Discussed hydration, compression stockings, orthostasis precautions,  Also anemic -  start ferrous gluconate. F/u with cards   Levie Heritage 02/19/2020, 5:44 PM

## 2020-02-19 NOTE — MAU Note (Signed)
Pt states that around 1330 she vomited after she ate and tried to lay down because she was not feeling well. She got up ten minutes later to get some ice and then the first thing she remembers is waking up to EMS. Pt reports vomiting at that time with extreme stomach pains.   Pt also reports sweating.   Pt reports trouble breathing at the time, but now is not having trouble.   Denies vaginal bleeding or LOF.   Reports decreased fetal movement since the incident.

## 2020-02-20 NOTE — Progress Notes (Signed)
Cardiology Office Note:   Date:  02/24/2020  NAME:  Janet Ryan    MRN: [id number] DOB:  2000/09/16   PCP:  Patient, No Pcp Per  Cardiologist:  No primary care provider on file.   Referring MD: Federico Flake,*   Chief Complaint  Patient presents with  . Loss of Consciousness   History of Present Illness:   Janet Ryan is a 20 y.o. female with a hx of asthma who is being seen today for the evaluation of syncope at the request of Federico Flake,*. Seen by OB 02/10/2020 and had syncopal event in office. Sent to ED. Echo normal. Attributed to vasovagal event.   She reports has had 3 syncopal events in the last month.  She reports that she gets a sensation when standing or upon change in position of dizziness and lightheadedness.  She can also get short of breath and sweaty.  She reports she will then pass out.  She reports after roughly 10 minutes she gets back to normal.  She can feel her heart racing and her breath increasing when the symptoms do start.  She reports that the episodes are scary.  They are apparently occurring more frequently in pregnancy.  In the past she has had them as well.  She is also having issues with nausea during this pregnancy.  She is having difficulty keeping food down.  Review of labs also demonstrate she is anemic with a hemoglobin of 9.7.  She is on iron supplementation.  She does take aspirin due to a family history of preeclampsia.  Her EKG in office is normal.  There are no acute ischemic changes or evidence of prior infarction.  She was admitted to the hospital in late December after an episode in her obstetrician's office.  She was observed with no arrhythmias.  She had an echocardiogram that was stone cold normal.  She reports she started to increase her hydration.  She was not drinking enough water before these episodes started.  She reports she was drinking maybe 3-20 ounce bottles per day.  She has now doubled this.  I have encouraged her  to drink at least 6 to 7-20 ounce bottles per day.  Medical history significant for asthma.  No significant family history of early sudden cardiac death.  Her mother has high blood pressure.  She does not smoke, drink or use drugs.  No excess caffeinated beverages reported.  She reports there is no seizure like activity with the episodes.  She does not urinate or defecate on herself when she episodes.  Past Medical History: Past Medical History:  Diagnosis Date  . Asthma     Past Surgical History: Past Surgical History:  Procedure Laterality Date  . WISDOM TOOTH EXTRACTION  2016    Current Medications: Current Meds  Medication Sig  . aspirin 81 MG chewable tablet Chew by mouth daily.  Jae D[employer] Bandages & Supports (MEDICAL COMPRESSION STOCKINGS) MISC 2 each by Does not apply route continuous as needed.  . ferrous gluconate (FERGON) 324 MG tablet Take 1 tablet (324 mg total) by mouth every other day.  . ondansetron (ZOFRAN ODT) 4 MG disintegrating tablet Take 1 tablet (4 mg total) by mouth every 6 (six) hours as needed for nausea.  . potassium chloride 20 MEQ TBCR Take 20 mEq by mouth 2 (two) times daily for 5 days.  . Prenatal Vit-Fe Fumarate-FA (PRENATAL MULTIVITAMIN) TABS tablet Take 1 tablet by mouth daily at 12 noon.  Marland Kitchen terconazole (TERAZOL  7) 0.4 % vaginal cream Place 1 applicator vaginally at bedtime.     Allergies:    Nasonex [mometasone] and Shellfish allergy   Social History: Social History   Socioeconomic History  . Marital status: Single    Spouse name: Not on file  . Number of children: Not on file  . Years of education: Not on file  . Highest education level: Not on file  Occupational History  . Occupation: unemployed  Tobacco Use  . Smoking status: Never Smoker  . Smokeless tobacco: Never Used  Vaping Use  . Vaping Use: Never used  Substance and Sexual Activity  . Alcohol use: Never  . Drug use: Never  . Sexual activity: Not Currently  Other Topics  Concern  . Not on file  Social History Narrative  . Not on file   Social Determinants of Health   Financial Resource Strain: Not on file  Food Insecurity: Not on file  Transportation Needs: Not on file  Physical Activity: Not on file  Stress: Not on file  Social Connections: Not on file    Family History: The patient's family history includes Hypertension in her maternal grandmother and mother.  ROS:   All other ROS reviewed and negative. Pertinent positives noted in the HPI.     EKGs/Labs/Other Studies Reviewed:   The following studies were personally reviewed by me today:  EKG:  EKG is ordered today.  The ekg ordered today demonstrates normal sinus rhythm heart rate 90, no acute ischemic changes, no evidence of infarction, and was personally reviewed by me.   TTE 02/10/2020 1. Left ventricular ejection fraction, by estimation, is 60 to 65%. The  left ventricle has normal function. The left ventricle has no regional  wall motion abnormalities. Left ventricular diastolic parameters were  normal.  2. Right ventricular systolic function is normal. The right ventricular  size is normal.  3. The mitral valve is normal in structure. No evidence of mitral valve  regurgitation. No evidence of mitral stenosis.  4. The aortic valve is normal in structure. Aortic valve regurgitation is  not visualized. No aortic stenosis is present.  5. The inferior vena cava is normal in size with greater than 50%  respiratory variability, suggesting right atrial pressure of 3 mmHg.   Recent Labs: 02/19/2020: ALT 13; BUN 7; Creatinine, Ser 0.84; Hemoglobin 9.7; Platelets 196; Potassium 3.4; Sodium 137   Recent Lipid Panel No results found for: CHOL, TRIG, HDL, CHOLHDL, VLDL, LDLCALC, LDLDIRECT  Physical Exam:   VS:  BP 116/62   Pulse 90   Ht 5\' 7"  (1.702 m)   Wt 176 lb 3.2 oz (79.9 kg)   BMI 27.60 kg/m    Wt Readings from Last 3 Encounters:  02/24/20 176 lb 3.2 oz (79.9 kg)  02/17/20  174 lb (78.9 kg)  01/27/20 165 lb 6.4 oz (75 kg)    General: Well nourished, well developed, in no acute distress Head: Atraumatic, normal size  Eyes: PEERLA, EOMI  Neck: Supple, no JVD Endocrine: No thryomegaly Cardiac: Normal S1, S2; RRR; no murmurs, rubs, or gallops Lungs: Clear to auscultation bilaterally, no wheezing, rhonchi or rales  Abd: Soft, nontender, no hepatomegaly  Ext: No edema, pulses 2+ Musculoskeletal: No deformities, BUE and BLE strength normal and equal Skin: Warm and dry, no rashes   Neuro: Alert and oriented to person, place, time, and situation, CNII-XII grossly intact, no focal deficits  Psych: Normal mood and affect   ASSESSMENT:   Janet Ryan is a  20 y.o. female who presents for the following: 1. Syncope and collapse   2. Vasovagal syncope     PLAN:   1. Syncope and collapse 2. Vasovagal syncope -Recurrent syncopal events.  Symptoms of nausea occur with the episodes as well as blurry vision. No seizure like activity reported.  Associated with changing position as well as prolonged standing.  She is also been a bit dehydrated from her report. -EKG is normal.  Echocardiogram is normal.  Episodes are consistent with vasovagal syncope.  I recommended adequate hydration as well as to sit down if the symptoms happen. No monitor needed. -She is anemic and this will not help.  She is working with her obstetrician to correct this.  I would like to check a TSH to make sure there is nothing abnormal here.  This could be worsening things. -Overall, she is young and healthy and her story is consistent with vasovagal syncope.  This is a benign course and she was counseled on conservative ways to circumvent these episodes.  Disposition: Return if symptoms worsen or fail to improve.  Medication Adjustments/Labs and Tests Ordered: Current medicines are reviewed at length with the patient today.  Concerns regarding medicines are outlined above.  Orders Placed This  Encounter  Procedures  . TSH  . EKG 12-Lead   No orders of the defined types were placed in this encounter.   Patient Instructions  Medication Instructions:  The current medical regimen is effective;  continue present plan and medications.  *If you need a refill on your cardiac medications before your next appointment, please call your pharmacy*   Lab Work: TSH today  If you have labs (blood work) drawn today and your tests are completely normal, you will receive your results only by: Marland Kitchen MyChart Message (if you have MyChart) OR . A paper copy in the mail If you have any lab test that is abnormal or we need to change your treatment, we will call you to review the results.  Follow-Up: At [facility], you and your health needs are our priority.  As part of our continuing mission to provide you with exceptional heart care, we have created designated Provider Care Teams.  These Care Teams include your primary Cardiologist (physician) and Advanced Practice Providers (APPs -  Physician Assistants and Nurse Practitioners) who all work together to provide you with the care you need, when you need it.  We recommend signing up for the patient portal called "MyChart".  Sign up information is provided on this After Visit Summary.  MyChart is used to connect with patients for Virtual Visits (Telemedicine).  Patients are able to view lab/test results, encounter notes, upcoming appointments, etc.  Non-urgent messages can be sent to your provider as well.   To learn more about what you can do with MyChart, go to [email or web address].    Your next appointment:   As needed  The format for your next appointment:   In Person  Provider:   Lennie Odor, MD   Other Instructions Drink plenty of water     Signed, Gerri Spore T. Flora Lipps, MD [facility]  [address] [place], [place] [address] [phone]  02/24/2020 10:14 AM

## 2020-02-21 LAB — GC/CHLAMYDIA PROBE AMP (~~LOC~~) NOT AT ARMC
Chlamydia: NEGATIVE
Comment: NEGATIVE
Comment: NORMAL
Neisseria Gonorrhea: NEGATIVE

## 2020-02-24 ENCOUNTER — Ambulatory Visit (INDEPENDENT_AMBULATORY_CARE_PROVIDER_SITE_OTHER): Payer: Medicaid Other | Admitting: Cardiovascular Disease

## 2020-02-24 ENCOUNTER — Other Ambulatory Visit: Payer: Self-pay

## 2020-02-24 ENCOUNTER — Encounter: Payer: Self-pay | Admitting: Cardiovascular Disease

## 2020-02-24 VITALS — BP 116/62 | HR 90 | Ht 67.0 in | Wt 176.2 lb

## 2020-02-24 DIAGNOSIS — R55 Syncope and collapse: Secondary | ICD-10-CM

## 2020-02-24 NOTE — Patient Instructions (Addendum)
Medication Instructions:  The current medical regimen is effective;  continue present plan and medications.  *If you need a refill on your cardiac medications before your next appointment, please call your pharmacy*   Lab Work: TSH today  If you have labs (blood work) drawn today and your tests are completely normal, you will receive your results only by: Marland Kitchen MyChart Message (if you have MyChart) OR . A paper copy in the mail If you have any lab test that is abnormal or we need to change your treatment, we will call you to review the results.  Follow-Up: At [facility], you and your health needs are our priority.  As part of our continuing mission to provide you with exceptional heart care, we have created designated Provider Care Teams.  These Care Teams include your primary Cardiologist (physician) and Advanced Practice Providers (APPs -  Physician Assistants and Nurse Practitioners) who all work together to provide you with the care you need, when you need it.  We recommend signing up for the patient portal called "MyChart".  Sign up information is provided on this After Visit Summary.  MyChart is used to connect with patients for Virtual Visits (Telemedicine).  Patients are able to view lab/test results, encounter notes, upcoming appointments, etc.  Non-urgent messages can be sent to your provider as well.   To learn more about what you can do with MyChart, go to [email or web address].    Your next appointment:   As needed  The format for your next appointment:   In Person  Provider:   Lennie Odor, MD   Other Instructions Drink plenty of water

## 2020-02-25 ENCOUNTER — Encounter: Payer: Self-pay | Admitting: *Deleted

## 2020-02-25 ENCOUNTER — Other Ambulatory Visit: Payer: Self-pay | Admitting: *Deleted

## 2020-02-25 ENCOUNTER — Ambulatory Visit: Payer: Medicaid Other | Attending: Student

## 2020-02-25 ENCOUNTER — Ambulatory Visit: Payer: Medicaid Other | Admitting: *Deleted

## 2020-02-25 DIAGNOSIS — O358XX0 Maternal care for other (suspected) fetal abnormality and damage, not applicable or unspecified: Secondary | ICD-10-CM

## 2020-02-25 DIAGNOSIS — J45909 Unspecified asthma, uncomplicated: Secondary | ICD-10-CM | POA: Insufficient documentation

## 2020-02-25 DIAGNOSIS — Z3403 Encounter for supervision of normal first pregnancy, third trimester: Secondary | ICD-10-CM | POA: Diagnosis present

## 2020-02-25 DIAGNOSIS — O99519 Diseases of the respiratory system complicating pregnancy, unspecified trimester: Secondary | ICD-10-CM | POA: Insufficient documentation

## 2020-02-25 DIAGNOSIS — O35EXX0 Maternal care for other (suspected) fetal abnormality and damage, fetal genitourinary anomalies, not applicable or unspecified: Secondary | ICD-10-CM

## 2020-02-25 LAB — TSH: TSH: 2.29 u[IU]/mL (ref 0.450–4.500)

## 2020-02-29 ENCOUNTER — Inpatient Hospital Stay (HOSPITAL_COMMUNITY)
Admission: AD | Admit: 2020-02-29 | Discharge: 2020-02-29 | Disposition: A | Discharge status: Home / Self Care | Payer: Medicaid Other | Attending: Obstetrics and Gynecology | Admitting: Obstetrics and Gynecology

## 2020-02-29 ENCOUNTER — Encounter (HOSPITAL_COMMUNITY): Payer: Self-pay | Admitting: Obstetrics and Gynecology

## 2020-02-29 ENCOUNTER — Telehealth: Payer: Self-pay | Admitting: *Deleted

## 2020-02-29 ENCOUNTER — Other Ambulatory Visit: Payer: Self-pay

## 2020-02-29 DIAGNOSIS — Z20822 Contact with and (suspected) exposure to covid-19: Secondary | ICD-10-CM | POA: Diagnosis not present

## 2020-02-29 DIAGNOSIS — E876 Hypokalemia: Secondary | ICD-10-CM

## 2020-02-29 DIAGNOSIS — O99891 Other specified diseases and conditions complicating pregnancy: Secondary | ICD-10-CM | POA: Insufficient documentation

## 2020-02-29 DIAGNOSIS — O219 Vomiting of pregnancy, unspecified: Secondary | ICD-10-CM

## 2020-02-29 DIAGNOSIS — R000 Tachycardia, unspecified: Secondary | ICD-10-CM | POA: Insufficient documentation

## 2020-02-29 DIAGNOSIS — E860 Dehydration: Secondary | ICD-10-CM

## 2020-02-29 DIAGNOSIS — Z3A34 34 weeks gestation of pregnancy: Secondary | ICD-10-CM | POA: Insufficient documentation

## 2020-02-29 DIAGNOSIS — O212 Late vomiting of pregnancy: Secondary | ICD-10-CM | POA: Diagnosis present

## 2020-02-29 DIAGNOSIS — Z7982 Long term (current) use of aspirin: Secondary | ICD-10-CM | POA: Insufficient documentation

## 2020-02-29 LAB — URINALYSIS, ROUTINE W REFLEX MICROSCOPIC
Bilirubin Urine: NEGATIVE
Glucose, UA: NEGATIVE mg/dL
Hgb urine dipstick: NEGATIVE
Ketones, ur: 80 mg/dL — AB
Nitrite: NEGATIVE
Protein, ur: 100 mg/dL — AB
Specific Gravity, Urine: 1.03 (ref 1.005–1.030)
pH: 5 (ref 5.0–8.0)

## 2020-02-29 LAB — COMPREHENSIVE METABOLIC PANEL
ALT: 21 U/L (ref 0–44)
AST: 32 U/L (ref 15–41)
Albumin: 3 g/dL — ABNORMAL LOW (ref 3.5–5.0)
Alkaline Phosphatase: 52 U/L (ref 38–126)
Anion gap: 14 (ref 5–15)
BUN: 11 mg/dL (ref 6–20)
CO2: 15 mmol/L — ABNORMAL LOW (ref 22–32)
Calcium: 8.9 mg/dL (ref 8.9–10.3)
Chloride: 104 mmol/L (ref 98–111)
Creatinine, Ser: 0.84 mg/dL (ref 0.44–1.00)
GFR, Estimated: >60 mL/min (ref >60)
Glucose, Bld: 98 mg/dL (ref 70–99)
Potassium: 3.2 mmol/L — ABNORMAL LOW (ref 3.5–5.1)
Sodium: 133 mmol/L — ABNORMAL LOW (ref 135–145)
Total Bilirubin: 0.9 mg/dL (ref 0.3–1.2)
Total Protein: 6.5 g/dL (ref 6.5–8.1)

## 2020-02-29 LAB — CBC
HCT: 31.2 % — ABNORMAL LOW (ref 36.0–46.0)
Hemoglobin: 11 g/dL — ABNORMAL LOW (ref 12.0–15.0)
MCH: 31.5 pg (ref 26.0–34.0)
MCHC: 35.3 g/dL (ref 30.0–36.0)
MCV: 89.4 fL (ref 80.0–100.0)
Platelets: 213 10*3/uL (ref 150–400)
RBC: 3.49 MIL/uL — ABNORMAL LOW (ref 3.87–5.11)
RDW: 13.2 % (ref 11.5–15.5)
WBC: 6.9 10*3/uL (ref 4.0–10.5)
nRBC: 0 % (ref 0.0–0.2)

## 2020-02-29 LAB — RESP PANEL BY RT-PCR (FLU A&B, COVID) ARPGX2
Influenza A by PCR: NEGATIVE
Influenza B by PCR: NEGATIVE
SARS Coronavirus 2 by RT PCR: NEGATIVE

## 2020-02-29 MED ORDER — PROMETHAZINE HCL 25 MG/ML IJ SOLN
12.5000 mg | Freq: Once | INTRAMUSCULAR | Status: AC
  Administered 2020-02-29: 12.5 mg via INTRAVENOUS
  Filled 2020-02-29: qty 1

## 2020-02-29 MED ORDER — PROMETHAZINE HCL 12.5 MG PO TABS: 12.5000 mg | ORAL_TABLET | Freq: Four times a day (QID) | ORAL | 0 refills | Status: DC | PRN

## 2020-02-29 MED ORDER — LACTATED RINGERS IV BOLUS: 1000.0000 mL | Freq: Once | INTRAVENOUS | Status: DC

## 2020-02-29 MED ORDER — LACTATED RINGERS IV BOLUS
2000.0000 mL | Freq: Once | INTRAVENOUS | Status: AC
  Administered 2020-02-29: 1000 mL via INTRAVENOUS

## 2020-02-29 MED ORDER — ACETAMINOPHEN 500 MG PO TABS: 1000.0000 mg | ORAL_TABLET | Freq: Once | ORAL | Status: DC

## 2020-02-29 NOTE — MAU Note (Signed)
Pt presents via EMS with complaint of N/V x 3 days. Unable to keep anything down. Family at home sick with same symptoms. Low grade fever at home until yesterday

## 2020-02-29 NOTE — Discharge Instructions (Signed)
Dehydration, Adult Dehydration is condition in which there is not enough water or other fluids in the body. This happens when a person loses more fluids than he or she takes in. Important body parts cannot work right without the right amount of fluids. Any loss of fluids from the body can cause dehydration. Dehydration can be mild, worse, or very bad. It should be treated right away to keep it from getting very bad. What are the causes? This condition may be caused by:  Conditions that cause loss of water or other fluids, such as: ? Watery poop (diarrhea). ? Vomiting. ? Sweating a lot. ? Peeing (urinating) a lot.  Not drinking enough fluids, especially when you: ? Are ill. ? Are doing things that take a lot of energy to do.  Other illnesses and conditions, such as fever or infection.  Certain medicines, such as medicines that take extra fluid out of the body (diuretics).  Lack of safe drinking water.  Not being able to get enough water and food. What increases the risk? The following factors may make you more likely to develop this condition:  Having a long-term (chronic) illness that has not been treated the right way, such as: ? Diabetes. ? Heart disease. ? Kidney disease.  Being 65 years of age or older.  Having a disability.  Living in a place that is high above the ground or sea (high in altitude). The thinner, dried air causes more fluid loss.  Doing exercises that put stress on your body for a long time. What are the signs or symptoms? Symptoms of dehydration depend on how bad it is. Mild or worse dehydration  Thirst.  Dry lips or dry mouth.  Feeling dizzy or light-headed, especially when you stand up from sitting.  Muscle cramps.  Your body making: ? Dark pee (urine). Pee may be the color of tea. ? Less pee than normal. ? Less tears than normal.  Headache. Very bad dehydration  Changes in skin. Skin may: ? Be cold to the touch (clammy). ? Be blotchy  or pale. ? Not go back to normal right after you lightly pinch it and let it go.  Little or no tears, pee, or sweat.  Changes in vital signs, such as: ? Fast breathing. ? Low blood pressure. ? Weak pulse. ? Pulse that is more than 100 beats a minute when you are sitting still.  Other changes, such as: ? Feeling very thirsty. ? Eyes that look hollow (sunken). ? Cold hands and feet. ? Being mixed up (confused). ? Being very tired (lethargic) or having trouble waking from sleep. ? Short-term weight loss. ? Loss of consciousness. How is this treated? Treatment for this condition depends on how bad it is. Treatment should start right away. Do not wait until your condition gets very bad. Very bad dehydration is an emergency. You will need to go to a hospital.  Mild or worse dehydration can be treated at home. You may be asked to: ? Drink more fluids. ? Drink an oral rehydration solution (ORS). This drink helps get the right amounts of fluids and salts and minerals in the blood (electrolytes).  Very bad dehydration can be treated: ? With fluids through an IV tube. ? By getting normal levels of salts and minerals in your blood. This is often done by giving salts and minerals through a tube. The tube is passed through your nose and into your stomach. ? By treating the root cause. Follow these instructions at   home: Oral rehydration solution If told by your doctor, drink an ORS:  Make an ORS. Use instructions on the package.  Start by drinking small amounts, about  cup (120 mL) every 5-10 minutes.  Slowly drink more until you have had the amount that your doctor said to have. Eating and drinking  Drink enough clear fluid to keep your pee pale yellow. If you were told to drink an ORS, finish the ORS first. Then, start slowly drinking other clear fluids. Drink fluids such as: ? Water. Do not drink only water. Doing that can make the salt (sodium) level in your body get too low. ? Water  from ice chips you suck on. ? Fruit juice that you have added water to (diluted). ? Low-calorie sports drinks.  Eat foods that have the right amounts of salts and minerals, such as: ? Bananas. ? Oranges. ? Potatoes. ? Tomatoes. ? Spinach.  Do not drink alcohol.  Avoid: ? Drinks that have a lot of sugar. These include:  High-calorie sports drinks.  Fruit juice that you did not add water to.  Soda.  Caffeine. ? Foods that are greasy or have a lot of fat or sugar.         General instructions  Take over-the-counter and prescription medicines only as told by your doctor.  Do not take salt tablets. Doing that can make the salt level in your body get too high.  Return to your normal activities as told by your doctor. Ask your doctor what activities are safe for you.  Keep all follow-up visits as told by your doctor. This is important. Contact a doctor if:  You have pain in your belly (abdomen) and the pain: ? Gets worse. ? Stays in one place.  You have a rash.  You have a stiff neck.  You get angry or annoyed (irritable) more easily than normal.  You are more tired or have a harder time waking than normal.  You feel: ? Weak or dizzy. ? Very thirsty. Get help right away if you have:  Any symptoms of very bad dehydration.  Symptoms of vomiting, such as: ? You cannot eat or drink without vomiting. ? Your vomiting gets worse or does not go away. ? Your vomit has blood or green stuff in it.  Symptoms that get worse with treatment.  A fever.  A very bad headache.  Problems with peeing or pooping (having a bowel movement), such as: ? Watery poop that gets worse or does not go away. ? Blood in your poop (stool). This may cause poop to look black and tarry. ? Not peeing in 6-8 hours. ? Peeing only a small amount of very dark pee in 6-8 hours.  Trouble breathing. These symptoms may be an emergency. Do not wait to see if the symptoms will go away. Get  medical help right away. Call your local emergency services (911 in the [place] Do not drive yourself to the hospital. Summary  Dehydration is a condition in which there is not enough water or other fluids in the body. This happens when a person loses more fluids than he or she takes in.  Treatment for this condition depends on how bad it is. Treatment should be started right away. Do not wait until your condition gets very bad.  Drink enough clear fluid to keep your pee pale yellow. If you were told to drink an oral rehydration solution (ORS), finish the ORS first. Then, start slowly drinking other clear fluids.    Take over-the-counter and prescription medicines only as told by your doctor.  Get help right away if you have any symptoms of very bad dehydration. This information is not intended to replace advice given to you by your health care provider. Make sure you discuss any questions you have with your health care provider. Document Revised: 09/10/2018 Document Reviewed: 09/10/2018 Elsevier Patient Education  2021 [employer]  

## 2020-02-29 NOTE — Telephone Encounter (Signed)
Attempted to call pt to follow up on after hours nurse line message from the weekend regarding stomach virus and being dehydrated.

## 2020-02-29 NOTE — MAU Provider Note (Signed)
History     [id number]   Arrival date and time: 02/29/20 1034    Chief Complaint  Patient presents with  . Nausea  . Emesis     HPI Janet Ryan is a 20 y.o. at [redacted]w[redacted]d by patient report with PMHx notable for multiple syncopal episodes s/p reassuring evaluation w Cardiology, who presents for nausea and vomiting.   Review of chart: Patient recently seen at [facility] on February 19, 2020, at that time and had a syncopal episode reported multiple priors.  Had extensive cardiac work-up and ultimately etiology was thought to be due to vasovagal syncope.  Additionally was seen and cardiology clinic on February 24, 2020, at that time cardiologist also felt that her syncopal episodes were due to vasovagal syncope.  Today reports she has had 2 days of nausea and vomiting and inability to take either liquids or solids.  In addition multiple family members stretching into the late last week have also had the same symptoms.  None of them have been tested for COVID recently.  She has not been vaccinated for COVID.  Reports only symptom is nausea and vomiting, has seen some blood in her emesis.  She denies nasal congestion, cough, chest pain, shortness of breath, vaginal discharge.  She does endorse some mild burning with urination.  She had a temperature as high as 100.3 yesterday but no other episodes of elevated temperature.  She denies vaginal bleeding, loss of fluid, contractions.  She endorses good fetal movement.      OB History    Gravida  2   Para      Term      Preterm      AB  1   Living        SAB  1   IAB      Ectopic      Multiple      Live Births              Past Medical History:  Diagnosis Date  . Asthma     Past Surgical History:  Procedure Laterality Date  . WISDOM TOOTH EXTRACTION  2016    Family History  Problem Relation Age of Onset  . Hypertension Mother   . Hypertension Maternal Grandmother     Social History   Socioeconomic History  . Marital  status: Single    Spouse name: Not on file  . Number of children: Not on file  . Years of education: Not on file  . Highest education level: Not on file  Occupational History  . Occupation: unemployed  Tobacco Use  . Smoking status: Never Smoker  . Smokeless tobacco: Never Used  Vaping Use  . Vaping Use: Never used  Substance and Sexual Activity  . Alcohol use: Never  . Drug use: Never  . Sexual activity: Not Currently  Other Topics Concern  . Not on file  Social History Narrative  . Not on file   Social Determinants of Health   Financial Resource Strain: Not on file  Food Insecurity: Not on file  Transportation Needs: Not on file  Physical Activity: Not on file  Stress: Not on file  Social Connections: Not on file  Intimate Partner Violence: Not on file    Allergies  Allergen Reactions  . Nasonex [Mometasone] Shortness Of Breath  . Shellfish Allergy     No current facility-administered medications on file prior to encounter.   Current Outpatient Medications on File Prior to Encounter  Medication Sig Dispense  Refill  . aspirin 81 MG chewable tablet Chew by mouth daily.    [employer]ae [employer] Bandages & Supports (MEDICAL COMPRESSION STOCKINGS) MISC 2 each by Does not apply route continuous as needed. 2 each 0  . ferrous gluconate (FERGON) 324 MG tablet Take 1 tablet (324 mg total) by mouth every other day. 30 tablet 3  . ondansetron (ZOFRAN ODT) 4 MG disintegrating tablet Take 1 tablet (4 mg total) by mouth every 6 (six) hours as needed for nausea. 20 tablet 0  . potassium chloride 20 MEQ TBCR Take 20 mEq by mouth 2 (two) times daily for 5 days. 10 tablet 0  . Prenatal Vit-Fe Fumarate-FA (PRENATAL MULTIVITAMIN) TABS tablet Take 1 tablet by mouth daily at 12 noon.    Marland Kitchen terconazole (TERAZOL 7) 0.4 % vaginal cream Place 1 applicator vaginally at bedtime. (Patient not taking: Reported on 02/25/2020) 45 g 0     ROS Pertinent positives and negative per HPI, all others reviewed and  negative  Physical Exam   BP (!) 95/57 (BP Location: Right Arm)   Pulse 96   Temp 98.8 F (37.1 C) (Oral)   Resp 20   Ht 5\' 7"  (1.702 m)   Wt 79.8 kg   SpO2 100%   BMI 27.57 kg/m   Patient Vitals for the past 24 hrs:  BP Temp Temp src Pulse Resp SpO2 Height Weight  02/29/20 1220 -- -- -- 96 -- 100 % -- --  02/29/20 1200 -- -- -- 94 -- 99 % -- --  02/29/20 1049 (!) 95/57 98.8 F (37.1 C) Oral (!) 135 20 100 % 5\' 7"  (1.702 m) 79.8 kg    Physical Exam Vitals reviewed.  Constitutional:      General: She is not in acute distress.    Appearance: She is well-developed and well-nourished. She is not diaphoretic.  Eyes:     General: No scleral icterus. Pulmonary:     Effort: Pulmonary effort is normal. No respiratory distress.  Abdominal:     General: There is no distension.     Palpations: Abdomen is soft.     Tenderness: There is no abdominal tenderness. There is no guarding or rebound.  Musculoskeletal:        General: No edema.  Skin:    General: Skin is warm and dry.  Neurological:     Mental Status: She is alert.     Coordination: Coordination normal.  Psychiatric:        Mood and Affect: Mood and affect normal.      Cervical Exam    Bedside Ultrasound Not done  My interpretation: n/a  FHT Baseline 150, moderate variability, +accels, no decels Toco: none Cat: I  Labs Results for orders placed or performed during the hospital encounter of 02/29/20 (from the past 24 hour(s))  Urinalysis, Routine w reflex microscopic Urine, Clean Catch     Status: Abnormal   Collection Time: 02/29/20 10:57 AM  Result Value Ref Range   Color, Urine AMBER (A) YELLOW   APPearance HAZY (A) CLEAR   Specific Gravity, Urine 1.030 1.005 - 1.030   pH 5.0 5.0 - 8.0   Glucose, UA NEGATIVE NEGATIVE mg/dL   Hgb urine dipstick NEGATIVE NEGATIVE   Bilirubin Urine NEGATIVE NEGATIVE   Ketones, ur 80 (A) NEGATIVE mg/dL   Protein, ur 03/02/20 (A) NEGATIVE mg/dL   Nitrite NEGATIVE NEGATIVE    Leukocytes,Ua SMALL (A) NEGATIVE   RBC / HPF 0-5 0 - 5 RBC/hpf   WBC, UA  21-50 0 - 5 WBC/hpf   Bacteria, UA FEW (A) NONE SEEN   Squamous Epithelial / LPF 6-10 0 - 5   Mucus PRESENT   CBC     Status: Abnormal   Collection Time: 02/29/20 11:24 AM  Result Value Ref Range   WBC 6.9 4.0 - 10.5 K/uL   RBC 3.49 (L) 3.87 - 5.11 MIL/uL   Hemoglobin 11.0 (L) 12.0 - 15.0 g/dL   HCT 25.3 (L) 66.4 - 40.3 %   MCV 89.4 80.0 - 100.0 fL   MCH 31.5 26.0 - 34.0 pg   MCHC 35.3 30.0 - 36.0 g/dL   RDW 47.4 25.9 - 56.3 %   Platelets 213 150 - 400 K/uL   nRBC 0.0 0.0 - 0.2 %  Comprehensive metabolic panel     Status: Abnormal   Collection Time: 02/29/20 11:24 AM  Result Value Ref Range   Sodium 133 (L) 135 - 145 mmol/L   Potassium 3.2 (L) 3.5 - 5.1 mmol/L   Chloride 104 98 - 111 mmol/L   CO2 15 (L) 22 - 32 mmol/L   Glucose, Bld 98 70 - 99 mg/dL   BUN 11 6 - 20 mg/dL   Creatinine, Ser 8.75 0.44 - 1.00 mg/dL   Calcium 8.9 8.9 - 64.3 mg/dL   Total Protein 6.5 6.5 - 8.1 g/dL   Albumin 3.0 (L) 3.5 - 5.0 g/dL   AST 32 15 - 41 U/L   ALT 21 0 - 44 U/L   Alkaline Phosphatase 52 38 - 126 U/L   Total Bilirubin 0.9 0.3 - 1.2 mg/dL   GFR, Estimated >32 >95 mL/min   Anion gap 14 5 - 15  Resp Panel by RT-PCR (Flu A&B, Covid) Nasopharyngeal Swab     Status: None   Collection Time: 02/29/20 11:24 AM   Specimen: Nasopharyngeal Swab; Nasopharyngeal(NP) swabs in vial transport medium  Result Value Ref Range   SARS Coronavirus 2 by RT PCR NEGATIVE NEGATIVE   Influenza A by PCR NEGATIVE NEGATIVE   Influenza B by PCR NEGATIVE NEGATIVE    Imaging No results found.  MAU Course  Procedures  Lab Orders     Resp Panel by RT-PCR (Flu A&B, Covid) Nasopharyngeal Swab     OB Urine Culture     Urinalysis, Routine w reflex microscopic Urine, Clean Catch     CBC     Comprehensive metabolic panel Meds ordered this encounter  Medications  . DISCONTD: lactated ringers bolus 1,000 mL  . DISCONTD: acetaminophen  (TYLENOL) tablet 1,000 mg  . lactated ringers bolus 2,000 mL  . promethazine (PHENERGAN) injection 12.5 mg   Imaging Orders  No imaging studies ordered today    MDM moderate  Assessment and Plan  #Nausea and vomiting, third trimester of pregnancy Patient presented with 2-day history of nausea and vomiting, as well as notable tachycardia with pulse 135 and mild hypotension with blood pressure 95/57.  Demonstrates concern for COVID, will obtain his test, alternate possibilities including a viral GI illness, less likely food poisoning given the prolonged period of symptoms.  Will check basic labs,, and give symptomatic treatment symptoms and reassess. 11:39 AM  Patient feeling significantly better after Phenergan and IV fluids.  COVID and flu test are negative, CMP notable only for a potassium of 3.2, remainder of labs unremarkable.  Likely viral GI illness, recommend bland diet, continue hydration, can be managed at home.  Low suspicion for UTI, but urine culture sent will call if returns positive.  #  FWB FHT Cat I NST: Reactive  Discharged to home in stable condition.   Venora MaplesMatthew M Jiali Linney, MD/MPH 02/29/20 1:19 PM

## 2020-03-01 LAB — CULTURE, OB URINE: Culture: NO GROWTH

## 2020-03-02 ENCOUNTER — Other Ambulatory Visit: Payer: Self-pay

## 2020-03-02 ENCOUNTER — Telehealth (INDEPENDENT_AMBULATORY_CARE_PROVIDER_SITE_OTHER): Payer: Medicaid Other | Admitting: Family Medicine

## 2020-03-02 DIAGNOSIS — J45909 Unspecified asthma, uncomplicated: Secondary | ICD-10-CM

## 2020-03-02 DIAGNOSIS — Z3403 Encounter for supervision of normal first pregnancy, third trimester: Secondary | ICD-10-CM

## 2020-03-02 DIAGNOSIS — O99519 Diseases of the respiratory system complicating pregnancy, unspecified trimester: Secondary | ICD-10-CM

## 2020-03-02 DIAGNOSIS — Z3A34 34 weeks gestation of pregnancy: Secondary | ICD-10-CM

## 2020-03-02 DIAGNOSIS — O99513 Diseases of the respiratory system complicating pregnancy, third trimester: Secondary | ICD-10-CM

## 2020-03-02 MED ORDER — FLUCONAZOLE 150 MG PO TABS: 150.0000 mg | ORAL_TABLET | Freq: Every day | ORAL | 0 refills | Status: DC

## 2020-03-02 NOTE — Progress Notes (Signed)
OBSTETRICS PRENATAL VIRTUAL VISIT ENCOUNTER NOTE  Provider location: [facility]   I connected with Janet Ryan on 03/02/20 at  2:45 PM EST by MyChart Video Encounter at home and verified that I am speaking with the correct person using two identifiers.   I discussed the limitations, risks, security and privacy concerns of performing an evaluation and management service virtually and the availability of in person appointments. I also discussed with the patient that there may be a patient responsible charge related to this service. The patient expressed understanding and agreed to proceed. Subjective:  Janet Ryan is a 20 y.o. G2P0010 at [redacted]w[redacted]d being seen today for ongoing prenatal care.  She is currently monitored for the following issues for this low-risk pregnancy and has Encounter for supervision of normal first pregnancy in third trimester; Biological false positive RPR test; and Asthma during pregnancy on their problem list.  Patient reports no complaints.  Contractions: Not present. Vag. Bleeding: None.  Movement: Present. Denies any leaking of fluid.   The following portions of the patient's history were reviewed and updated as appropriate: allergies, current medications, past family history, past medical history, past social history, past surgical history and problem list.   Objective:  There were no vitals filed for this visit.  Fetal Status:     Movement: Present     General:  Alert, oriented and cooperative. Patient is in no acute distress.  Respiratory: Normal respiratory effort, no problems with respiration noted  Mental Status: Normal mood and affect. Normal behavior. Normal judgment and thought content.  Rest of physical exam deferred due to type of encounter  Imaging: DG CHEST PORT 1 VIEW  Result Date: 02/19/2020 CLINICAL DATA:  Syncope EXAM: PORTABLE CHEST 1 VIEW COMPARISON:  None. FINDINGS: The cardiomediastinal silhouette is  normal in contour. No pleural effusion. No pneumothorax. No acute pleuroparenchymal abnormality. Visualized abdomen is unremarkable. No acute osseous abnormality noted. IMPRESSION: No acute cardiopulmonary abnormality. Electronically Signed   By: Meda Klinefelter MD   On: 02/19/2020 15:37   [place] MFM OB DETAIL +14 WK  Result Date: 02/25/2020 ----------------------------------------------------------------------  OBSTETRICS REPORT                       (Signed Final 02/25/2020 01:48 pm) ---------------------------------------------------------------------- Patient Info  ID #:       [id number]                          D.O.B.:  October 19, 2000 (19 yrs)  Name:       Janet Ryan                  Visit Date: 02/25/2020 10:09 am ---------------------------------------------------------------------- Performed By  Attending:        Ma Rings MD         Ref. Address:     [address] [place]  [place] [address]  Performed By:     Reinaldo Raddle            [facility]  Referred By:      Jorje Guild CNM ---------------------------------------------------------------------- Orders  #  Description                           Code        Ordered By  1  [place] MFM OB DETAIL +14 WK               76811.01    Luna Kitchens ----------------------------------------------------------------------  #  Order #                     Accession #                Episode #  1  [id number]                   [id number]                 [id number] ---------------------------------------------------------------------- Indications  Medical complication of pregnancy               O26.90  (Syncope, asthma)  [redacted] weeks gestation of pregnancy                Z3A.33  Encounter for antenatal screening for          Z36.3  malformations ---------------------------------------------------------------------- Fetal Evaluation  Num Of Fetuses:         1  Fetal Heart Rate(bpm):  157  Cardiac Activity:       Observed  Presentation:           Cephalic  Placenta:               Anterior  P. Cord Insertion:      Visualized, central  Amniotic Fluid  AFI FV:      Within normal limits  AFI Sum(cm)     %Tile       Largest Pocket(cm)  12.08           34          4.78  RUQ(cm)  RLQ(cm)       LUQ(cm)        LLQ(cm)  3.79          4.78          1.92           1.59 ---------------------------------------------------------------------- Biometry  BPD:      80.8  mm     G. Age:  32w 3d         16  %    CI:        72.85   %    70 - 86                                                          FL/HC:      22.8   %    19.4 - 21.8  HC:       301   mm     G. Age:  33w 3d         13  %    HC/AC:      0.95        0.96 - 1.11  AC:      317.3  mm     G. Age:  35w 5d         95  %    FL/BPD:     84.9   %    71 - 87  FL:       68.6  mm     G. Age:  35w 2d         81  %    FL/AC:      21.6   %    20 - 24  HUM:        58  mm     G. Age:  33w 4d         60  %  CER:      37.9  mm     G. Age:  31w 1d        4.1  %  LV:        1.7  mm  CM:        5.6  mm  Est. FW:    2555  gm    5 lb 10 oz      82  % ---------------------------------------------------------------------- OB History  Gravidity:    2  Living:       0 ---------------------------------------------------------------------- Gestational Age  Clinical EDD:  33w 4d                                        EDD:   04/10/20  U/S Today:     34w 2d                                        EDD:   04/05/20  Best:          33w 4d     Det. By:  Clinical EDD             EDD:   04/10/20 ---------------------------------------------------------------------- Anatomy  Cranium:  Appears normal         LVOT:                   Not well visualized  Cavum:                 Appears normal         Aortic Arch:            Appears normal  Ventricles:            Appears normal         Ductal Arch:            Appears normal  Choroid Plexus:        Appears normal         Diaphragm:              Appears normal  Cerebellum:            Appears normal         Stomach:                Appears normal, left                                                                        sided  Posterior Fossa:       Appears normal         Abdomen:                Appears normal  Nuchal Fold:           Not applicable (>20    Abdominal Wall:         Not well visualized                         wks GA)  Face:                  Orbits nl; profile not Cord Vessels:           Appears normal (3                         well visualized                                vessel cord)  Lips:                  Not well visualized    Kidneys:                Right sided                                                                        pyelectasis,  7.1  mm  Palate:                Not well visualized    Bladder:                Appears normal  Thoracic:              Appears normal         Spine:                  Ltd views no                                                                        intracranial signs of                                                                        NTD  Heart:                 Not well visualized    Upper Extremities:      RUE Vis, LUE NWV  RVOT:                  Appears normal         Lower Extremities:      RLE vis, LLE NWV  Other:  Technically difficult due to advanced GA and fetal position. ---------------------------------------------------------------------- Cervix Uterus Adnexa  Cervix  Not visualized (advanced GA >24wks)  Uterus  No abnormality visualized.  Right Ovary  Not visualized.  Left Ovary  Not visualized.  Cul De Sac   No free fluid seen.  Adnexa  No adnexal mass visualized. ---------------------------------------------------------------------- Comments  This patient was seen for a detailed fetal anatomy scan as  she recently transferred her care to another institution for  delivery at [facility].  She denies any problems in her  current pregnancy and denies any significant past medical  history.  She had a cell free DNA test earlier in her pregnancy which  indicated a low risk for trisomy 121, 1418, and 13. A female fetus  is predicted.  She was informed that the fetal growth and amniotic fluid  level were appropriate for her gestational age.  Mild right pyelectasis measuring 0.7 cm was noted today.  The patient reports that pyelectasis was noted during her  earlier ultrasounds.  She was reassured that the mild right  pyelectasis noted today is most likely a normal variant.  Mild  pyelectasis noted during prenatal ultrasounds often resolve  after birth.  She was advised that her baby will require further  imaging studies of the kidneys after delivery to determine if  any treatment is necessary.  She was advised to notify her  pediatrician regarding the pyelectasis that was noted during  her prenatal ultrasounds.  The views of the fetal anatomy were limited today due to her  advanced gestational age.  The patient was informed that anomalies may be missed due  to technical limitations. If the fetus is  in a suboptimal position  or maternal habitus is increased, visualization of the fetus in  the maternal uterus may be impaired.  A follow-up exam was scheduled in 4 weeks to assess the  fetal growth and to assess the fetal kidneys. ----------------------------------------------------------------------                   Ma Rings, MD Electronically Signed Final Report   02/25/2020 01:48 pm ----------------------------------------------------------------------  ECHOCARDIOGRAM COMPLETE  Result Date: 02/10/2020    ECHOCARDIOGRAM  REPORT   Patient Name:   NAMRATA DANGLER Date of Exam: 02/10/2020 Medical Rec #:  [id number]      Height:       67.0 in Accession #:    [id number]     Weight:       165.4 lb Date of Birth:  01/06/2001      BSA:          1.865 m Patient Age:    19 years       BP:           111/70 mmHg Patient Gender: F              HR:           76 bpm. Exam Location:  Inpatient Procedure: 2D Echo, Color Doppler and Cardiac Doppler STAT ECHO Indications:    R55 Syncope  History:        Patient has no prior history of Echocardiogram examinations. [redacted]                 weeks pregnant at time of study.  Sonographer:    Irving Burton Senior RDCS Referring Phys: [id number] MATTHEW M ECKSTAT IMPRESSIONS  1. Left ventricular ejection fraction, by estimation, is 60 to 65%. The left ventricle has normal function. The left ventricle has no regional wall motion abnormalities. Left ventricular diastolic parameters were normal.  2. Right ventricular systolic function is normal. The right ventricular size is normal.  3. The mitral valve is normal in structure. No evidence of mitral valve regurgitation. No evidence of mitral stenosis.  4. The aortic valve is normal in structure. Aortic valve regurgitation is not visualized. No aortic stenosis is present.  5. The inferior vena cava is normal in size with greater than 50% respiratory variability, suggesting right atrial pressure of 3 mmHg. FINDINGS  Left Ventricle: Left ventricular ejection fraction, by estimation, is 60 to 65%. The left ventricle has normal function. The left ventricle has no regional wall motion abnormalities. The left ventricular internal cavity size was normal in size. There is  no left ventricular hypertrophy. Left ventricular diastolic parameters were normal. Right Ventricle: The right ventricular size is normal. No increase in right ventricular wall thickness. Right ventricular systolic function is normal. Left Atrium: Left atrial size was normal in size. Right Atrium: Right atrial size was  normal in size. Pericardium: There is no evidence of pericardial effusion. Mitral Valve: The mitral valve is normal in structure. No evidence of mitral valve regurgitation. No evidence of mitral valve stenosis. Tricuspid Valve: The tricuspid valve is normal in structure. Tricuspid valve regurgitation is not demonstrated. No evidence of tricuspid stenosis. Aortic Valve: The aortic valve is normal in structure. Aortic valve regurgitation is not visualized. No aortic stenosis is present. Pulmonic Valve: The pulmonic valve was normal in structure. Pulmonic valve regurgitation is not visualized. No evidence of pulmonic stenosis. Aorta: The aortic root is normal in size and structure. Venous: The inferior vena cava is normal in size  with greater than 50% respiratory variability, suggesting right atrial pressure of 3 mmHg. IAS/Shunts: No atrial level shunt detected by color flow Doppler.  LEFT VENTRICLE PLAX 2D LVIDd:         4.20 cm LVIDs:         2.80 cm LV PW:         0.90 cm LV IVS:        0.90 cm LVOT diam:     1.90 cm LV SV:         64 LV SV Index:   35 LVOT Area:     2.84 cm  RIGHT VENTRICLE RV S prime:     12.50 cm/s TAPSE (M-mode): 2.2 cm LEFT ATRIUM             Index       RIGHT ATRIUM           Index LA diam:        3.40 cm 1.82 cm/m  RA Area:     16.20 cm LA Vol (A2C):   58.7 ml 31.47 ml/m RA Volume:   41.00 ml  21.98 ml/m LA Vol (A4C):   50.6 ml 27.13 ml/m LA Biplane Vol: 57.8 ml 30.99 ml/m  AORTIC VALVE LVOT Vmax:   112.00 cm/s LVOT Vmean:  71.800 cm/s LVOT VTI:    0.227 m  AORTA Ao Root diam: 2.60 cm Ao Asc diam:  2.70 cm MITRAL VALVE MV Area (PHT): 3.20 cm    SHUNTS MV Decel Time: 237 msec    Systemic VTI:  0.23 m MV E velocity: 99.00 cm/s  Systemic Diam: 1.90 cm MV A velocity: 75.20 cm/s MV E/A ratio:  1.32 Donato Schultz MD Electronically signed by Donato Schultz MD Signature Date/Time: 02/10/2020/4:53:17 PM    Final     Assessment and Plan:  Pregnancy: G2P0010 at [redacted]w[redacted]d 1. Asthma during  pregnancy Doing well  2. Encounter for supervision of normal first pregnancy in third trimester Recent GI illness, with negative COVID testing. Feels better. Cultures next visit.  Preterm labor symptoms and general obstetric precautions including but not limited to vaginal bleeding, contractions, leaking of fluid and fetal movement were reviewed in detail with the patient. I discussed the assessment and treatment plan with the patient. The patient was provided an opportunity to ask questions and all were answered. The patient agreed with the plan and demonstrated an understanding of the instructions. The patient was advised to call back or seek an in-person office evaluation/go to MAU at [facility] for any urgent or concerning symptoms. Please refer to After Visit Summary for other counseling recommendations.   I provided 12 minutes of face-to-face time during this encounter.  Return in 2 weeks (on 03/16/2020).  Future Appointments  Date Time Provider Department Center  03/16/2020  1:15 PM [place] Bing, MD [facility]  03/24/2020 11:00 AM [facility] NURSE [facility]  03/24/2020 11:15 AM [facility] US2 [facility]    Reva Bores, MD [facility], [facility]

## 2020-03-02 NOTE — Progress Notes (Signed)
I connected with  Janet Ryan on 03/02/20 at  2:45 PM EST by telephone and verified that I am speaking with the correct person using two identifiers.   I discussed the limitations, risks, security and privacy concerns of performing an evaluation and management service by telephone and the availability of in person appointments. I also discussed with the patient that there may be a patient responsible charge related to this service. The patient expressed understanding and agreed to proceed.  Scheryl Marten, RN 03/02/2020  2:50 PM   Ask about the aspirin with the issues she was having

## 2020-03-02 NOTE — Patient Instructions (Signed)

## 2020-03-04 ENCOUNTER — Encounter: Payer: Self-pay | Admitting: Student

## 2020-03-04 DIAGNOSIS — O35EXX0 Maternal care for other (suspected) fetal abnormality and damage, fetal genitourinary anomalies, not applicable or unspecified: Secondary | ICD-10-CM | POA: Insufficient documentation

## 2020-03-04 DIAGNOSIS — O358XX0 Maternal care for other (suspected) fetal abnormality and damage, not applicable or unspecified: Secondary | ICD-10-CM | POA: Insufficient documentation

## 2020-03-11 ENCOUNTER — Inpatient Hospital Stay (HOSPITAL_COMMUNITY)
Admission: AD | Admit: 2020-03-11 | Discharge: 2020-03-12 | Disposition: A | Discharge status: Home / Self Care | Payer: Medicaid Other | Source: Ambulatory Visit | Attending: Obstetrics and Gynecology | Admitting: Obstetrics and Gynecology

## 2020-03-11 ENCOUNTER — Other Ambulatory Visit: Payer: Self-pay

## 2020-03-11 ENCOUNTER — Encounter (HOSPITAL_COMMUNITY): Payer: Self-pay | Admitting: Obstetrics and Gynecology

## 2020-03-11 DIAGNOSIS — O26893 Other specified pregnancy related conditions, third trimester: Secondary | ICD-10-CM

## 2020-03-11 DIAGNOSIS — Z3A35 35 weeks gestation of pregnancy: Secondary | ICD-10-CM | POA: Diagnosis not present

## 2020-03-11 DIAGNOSIS — O4703 False labor before 37 completed weeks of gestation, third trimester: Secondary | ICD-10-CM | POA: Diagnosis not present

## 2020-03-11 DIAGNOSIS — O4700 False labor before 37 completed weeks of gestation, unspecified trimester: Secondary | ICD-10-CM

## 2020-03-11 DIAGNOSIS — R109 Unspecified abdominal pain: Secondary | ICD-10-CM | POA: Diagnosis not present

## 2020-03-11 DIAGNOSIS — J45909 Unspecified asthma, uncomplicated: Secondary | ICD-10-CM

## 2020-03-11 LAB — URINALYSIS, ROUTINE W REFLEX MICROSCOPIC
Bilirubin Urine: NEGATIVE
Glucose, UA: NEGATIVE mg/dL
Hgb urine dipstick: NEGATIVE
Ketones, ur: NEGATIVE mg/dL
Nitrite: NEGATIVE
Protein, ur: NEGATIVE mg/dL
Specific Gravity, Urine: 1.002 — ABNORMAL LOW (ref 1.005–1.030)
pH: 7 (ref 5.0–8.0)

## 2020-03-11 MED ORDER — NIFEDIPINE 10 MG PO CAPS
10.0000 mg | ORAL_CAPSULE | ORAL | Status: AC | PRN
Start: 2020-03-11 — End: 2020-03-11
  Administered 2020-03-11 (×4): 10 mg via ORAL
  Filled 2020-03-11 (×4): qty 1

## 2020-03-11 NOTE — MAU Provider Note (Signed)
Chief Complaint:  Contractions   Event Date/Time   First Provider Initiated Contact with Patient 03/11/20 2153     HPI: Janet Ryan is a 20 y.o. G2P0010 at 75w5dwho presents to maternity admissions reporting painful contractions all day and evening.  Becoming more painful   . She reports good fetal movement, denies LOF, vaginal bleeding, vaginal itching/burning, urinary symptoms, h/a, dizziness, n/v, diarrhea, constipation or fever/chills.  She denies headache, visual changes or RUQ abdominal pain.  Abdominal Pain This is a new problem. The current episode started today. The onset quality is gradual. The problem occurs intermittently. The problem has been unchanged. The pain is located in the suprapubic region, LLQ and RLQ. The pain is moderate. The quality of the pain is cramping. Pertinent negatives include no constipation, diarrhea, dysuria, fever, frequency, headaches or myalgias. Nothing aggravates the pain. The pain is relieved by nothing. She has tried nothing for the symptoms.    RN Note: Pt reports "moderate to severe" contractions since 1400 today, now 3-7 minutes. Mucous like discharge. Reports good fetal movement.  Past Medical History: Past Medical History:  Diagnosis Date  . Asthma     Past obstetric history: OB History  Gravida Para Term Preterm AB Living  2       1    SAB IAB Ectopic Multiple Live Births  1            # Outcome Date GA Lbr Len/2nd Weight Sex Delivery Anes PTL Lv  2 Current           1 SAB 06/2019            Past Surgical History: Past Surgical History:  Procedure Laterality Date  . WISDOM TOOTH EXTRACTION  2016    Family History: Family History  Problem Relation Age of Onset  . Hypertension Mother   . Hypertension Maternal Grandmother     Social History: Social History   Tobacco Use  . Smoking status: Never Smoker  . Smokeless tobacco: Never Used  Vaping Use  . Vaping Use: Never used  Substance Use Topics  . Alcohol use: Never   . Drug use: Never    Allergies:  Allergies  Allergen Reactions  . Nasonex [Mometasone] Shortness Of Breath  . Shellfish Allergy     Meds:  Medications Prior to Admission  Medication Sig Dispense Refill Last Dose  . aspirin 81 MG chewable tablet Chew by mouth daily.     [employer] Bandages & Supports (MEDICAL COMPRESSION STOCKINGS) MISC 2 each by Does not apply route continuous as needed. 2 each 0   . ferrous gluconate (FERGON) 324 MG tablet Take 1 tablet (324 mg total) by mouth every other day. 30 tablet 3   . fluconazole (DIFLUCAN) 150 MG tablet Take 1 tablet (150 mg total) by mouth daily. Repeat in 24 hours if needed 1 tablet 0   . ondansetron (ZOFRAN ODT) 4 MG disintegrating tablet Take 1 tablet (4 mg total) by mouth every 6 (six) hours as needed for nausea. 20 tablet 0   . potassium chloride 20 MEQ TBCR Take 20 mEq by mouth 2 (two) times daily for 5 days. 10 tablet 0   . Prenatal Vit-Fe Fumarate-FA (PRENATAL MULTIVITAMIN) TABS tablet Take 1 tablet by mouth daily at 12 noon.     . promethazine (PHENERGAN) 12.5 MG tablet Take 1 tablet (12.5 mg total) by mouth every 6 (six) hours as needed for nausea or vomiting. 30 tablet 0     I have  reviewed patient's Past Medical Hx, Surgical Hx, Family Hx, Social Hx, medications and allergies.   ROS:  Review of Systems  Constitutional: Negative for fever.  Gastrointestinal: Positive for abdominal pain. Negative for constipation and diarrhea.  Genitourinary: Negative for dysuria and frequency.  Musculoskeletal: Negative for myalgias.  Neurological: Negative for headaches.   Other systems negative  Physical Exam   Patient Vitals for the past 24 hrs:  BP Temp Temp src Pulse Resp SpO2 Height Weight  03/11/20 2126 121/66 98.9 F (37.2 C) Oral 86 16 100 % 5\' 7"  (1.702 m) 82.1 kg   Constitutional: Well-developed, well-nourished female in no acute distress.  Cardiovascular: normal rate and rhythm Respiratory: normal effort, clear to  auscultation bilaterally GI: Abd soft, non-tender, gravid appropriate for gestational age.   No rebound or guarding. MS: Extremities nontender, no edema, normal ROM Neurologic: Alert and oriented x 4.  GU: Neg CVAT.  PELVIC EXAM: Dilation: Fingertip Cervical Position: Posterior Station: Ballotable Exam by:: [email or web address], CNM    No change In cervix after treatment  FHT:  Baseline 140 , moderate variability, accelerations present, no decelerations Contractions: q 2-3 mins Irregular     Labs: Results for orders placed or performed during the hospital encounter of 03/11/20 (from the past 24 hour(s))  Urinalysis, Routine w reflex microscopic Urine, Clean Catch     Status: Abnormal   Collection Time: 03/11/20  9:31 PM  Result Value Ref Range   Color, Urine STRAW (A) YELLOW   APPearance CLEAR CLEAR   Specific Gravity, Urine 1.002 (L) 1.005 - 1.030   pH 7.0 5.0 - 8.0   Glucose, UA NEGATIVE NEGATIVE mg/dL   Hgb urine dipstick NEGATIVE NEGATIVE   Bilirubin Urine NEGATIVE NEGATIVE   Ketones, ur NEGATIVE NEGATIVE mg/dL   Protein, ur NEGATIVE NEGATIVE mg/dL   Nitrite NEGATIVE NEGATIVE   Leukocytes,Ua MODERATE (A) NEGATIVE   RBC / HPF 0-5 0 - 5 RBC/hpf   WBC, UA 0-5 0 - 5 WBC/hpf   Bacteria, UA RARE (A) NONE SEEN   Squamous Epithelial / LPF 0-5 0 - 5     Imaging:    MAU Course/MDM: I have ordered labs and reviewed results. UA is normal  NST reviewed, reactive  Treatments in MAU included Procardia series x 4.  This slowed but did not stop contractions.  She states they are less painful Recheck of cervix unchanged.  .   Assessment: Single IUP at [redacted]w[redacted]d Preterm uterine contractions No change in cervix   Plan: Discharge home Preterm Labor precautions and fetal kick counts Follow up in Office for prenatal visits   Encouraged to return if she develops worsening of symptoms, increase in pain, fever, or other concerning symptoms.  Pt stable at time of discharge.  [redacted]w[redacted]d CNM, MSN Certified Nurse-Midwife 03/11/2020 9:53 PM

## 2020-03-11 NOTE — MAU Note (Signed)
Pt reports "moderate to severe" contractions since 1400 today, now 3-7 minutes. Mucous like discharge. Reports good fetal movement.

## 2020-03-12 NOTE — Discharge Instructions (Signed)
Preterm Labor The normal length of a pregnancy is 39-41 weeks. Preterm labor is when labor starts before 37 completed weeks of pregnancy. Babies who are born prematurely and survive may not be fully developed and may be at an increased risk for long-term problems such as cerebral palsy, developmental delays, and vision and hearing problems. Babies who are born too early may have problems soon after birth. Problems may include regulating blood sugar, body temperature, heart rate, and breathing rate. These babies often have trouble with feeding. The risk of having problems is highest for babies who are born before 34 weeks of pregnancy. What are the causes? The exact cause of this condition is not known. What increases the risk? You are more likely to have preterm labor if you have certain risk factors that relate to your medical history, problems with present and past pregnancies, and lifestyle factors. Medical history  You have abnormalities of the uterus, including a short cervix.  You have STIs (sexually transmitted infections), or other infections of the urinary tract and the vagina.  You have chronic illnesses, such as blood clotting problems, diabetes, or high blood pressure.  You are overweight or underweight. Present and past pregnancies  You have had preterm labor before.  You are pregnant with twins or other multiples.  You have been diagnosed with a condition in which the placenta covers your cervix (placenta previa).  You waited less than 6 months between giving birth and becoming pregnant again.  Your unborn baby has some abnormalities.  You have vaginal bleeding during pregnancy.  You became pregnant through in vitro fertilization (IVF). Lifestyle and environmental factors  You use tobacco products.  You drink alcohol.  You use street drugs.  You have stress and no social support.  You experience domestic violence.  You are exposed to certain chemicals or  environmental pollutants. Other factors  You are younger than age 17 or older than age 35. What are the signs or symptoms? Symptoms of this condition include:  Cramps similar to those that can happen during a menstrual period. The cramps may happen with diarrhea.  Pain in the abdomen or lower back.  Regular contractions that may feel like tightening of the abdomen.  A feeling of increased pressure in the pelvis.  Increased watery or bloody mucus discharge from the vagina.  Water breaking (ruptured amniotic sac). How is this diagnosed? This condition is diagnosed based on:  Your medical history and a physical exam.  A pelvic exam.  An ultrasound.  Monitoring your uterus for contractions.  Other tests, including: ? A swab of the cervix to check for a chemical called fetal fibronectin. ? Urine tests. How is this treated? Treatment for this condition depends on the length of your pregnancy, your condition, and the health of your baby. Treatment may include:  Taking medicines, such as: ? Hormone medicines. These may be given early in pregnancy to help support the pregnancy. ? Medicines to stop contractions. ? Medicines to help mature the baby's lungs. These may be prescribed if the risk of delivery is high. ? Medicines to prevent your baby from developing cerebral palsy.  Bed rest. If the labor happens before 34 weeks of pregnancy, you may need to stay in the hospital.  Delivery of the baby. Follow these instructions at home:  Do not use any products that contain nicotine or tobacco, such as cigarettes, e-cigarettes, and chewing tobacco. If you need help quitting, ask your health care provider.  Do not drink alcohol.    Take over-the-counter and prescription medicines only as told by your health care provider.  Rest as told by your health care provider.  Return to your normal activities as told by your health care provider. Ask your health care provider what activities  are safe for you.  Keep all follow-up visits as told by your health care provider. This is important.   How is this prevented? To increase your chance of having a full-term pregnancy:  Do not use street drugs or medicines that have not been prescribed to you during your pregnancy.  Talk with your health care provider before taking any herbal supplements, even if you have been taking them regularly.  Make sure you gain a healthy amount of weight during your pregnancy.  Watch for infection. If you think that you might have an infection, get it checked right away. Symptoms of infection may include: ? Fever. ? Abnormal vaginal discharge or discharge that smells bad. ? Pain or burning with urination. ? Needing to urinate urgently. ? Frequently urinating or passing small amounts of urine frequently. ? Blood in your urine. ? Urine that smells bad or unusual.  Tell your health care provider if you have had preterm labor before. Contact a health care provider if:  You think you are going into preterm labor.  You have signs or symptoms of preterm labor.  You have symptoms of infection. Get help right away if:  You are having regular, painful contractions every 5 minutes or less.  Your water breaks. Summary  Preterm labor is labor that starts before you reach 37 weeks of pregnancy.  Delivering your baby early increases your baby's risk of developing lifelong problems.  The exact cause of preterm labor is unknown. However, having an abnormal uterus, an STI (sexually transmitted infection), or vaginal bleeding during pregnancy increases your risk for preterm labor.  Keep all follow-up visits as told by your health care provider. This is important.  Contact a health care provider if you have signs or symptoms of preterm labor. This information is not intended to replace advice given to you by your health care provider. Make sure you discuss any questions you have with your health care  provider. Document Revised: 03/02/2019 Document Reviewed: 03/02/2019 Elsevier Patient Education  2021 [employer]  

## 2020-03-16 ENCOUNTER — Other Ambulatory Visit: Payer: Self-pay

## 2020-03-16 ENCOUNTER — Ambulatory Visit (INDEPENDENT_AMBULATORY_CARE_PROVIDER_SITE_OTHER): Payer: Medicaid Other | Admitting: Obstetrics and Gynecology

## 2020-03-16 ENCOUNTER — Other Ambulatory Visit (HOSPITAL_COMMUNITY)
Admission: RE | Admit: 2020-03-16 | Discharge: 2020-03-16 | Disposition: A | Discharge status: Home / Self Care | Payer: Medicaid Other | Source: Ambulatory Visit | Attending: Obstetrics and Gynecology | Admitting: Obstetrics and Gynecology

## 2020-03-16 VITALS — BP 111/71 | HR 102 | Wt 178.0 lb

## 2020-03-16 DIAGNOSIS — Z3403 Encounter for supervision of normal first pregnancy, third trimester: Secondary | ICD-10-CM | POA: Insufficient documentation

## 2020-03-16 DIAGNOSIS — Z3A36 36 weeks gestation of pregnancy: Secondary | ICD-10-CM

## 2020-03-16 DIAGNOSIS — O358XX0 Maternal care for other (suspected) fetal abnormality and damage, not applicable or unspecified: Secondary | ICD-10-CM

## 2020-03-16 DIAGNOSIS — Z87898 Personal history of other specified conditions: Secondary | ICD-10-CM | POA: Insufficient documentation

## 2020-03-16 DIAGNOSIS — O35EXX0 Maternal care for other (suspected) fetal abnormality and damage, fetal genitourinary anomalies, not applicable or unspecified: Secondary | ICD-10-CM

## 2020-03-16 NOTE — Progress Notes (Signed)
Prenatal Visit Note Date: 03/16/2020 Clinic: [facility]-  Subjective:  Janet Ryan is a 20 y.o. G2P0010 at [redacted]w[redacted]d being seen today for ongoing prenatal care.  She is currently monitored for the following issues for this low-risk pregnancy and has Encounter for supervision of normal first pregnancy in third trimester; Biological false positive RPR test; Asthma during pregnancy; Fetal renal anomaly, single gestation; and History of syncope on their problem list.  Patient reports no complaints.   Contractions: Not present. Vag. Bleeding: None.  Movement: Present. Denies leaking of fluid.   The following portions of the patient's history were reviewed and updated as appropriate: allergies, current medications, past family history, past medical history, past social history, past surgical history and problem list. Problem list updated.  Objective:   Vitals:   03/16/20 1310  BP: 111/71  Pulse: (!) 102  Weight: 178 lb (80.7 kg)    Fetal Status: Fetal Heart Rate (bpm): 138 Fundal Height: 37 cm Movement: Present  Presentation: Vertex  General:  Alert, oriented and cooperative. Patient is in no acute distress.  Skin: Skin is warm and dry. No rash noted.   Cardiovascular: Normal heart rate noted  Respiratory: Normal respiratory effort, no problems with respiration noted  Abdomen: Soft, gravid, appropriate for gestational age. Pain/Pressure: Absent     Pelvic:  Cervical exam performed Dilation: Fingertip Effacement (%): 30    Extremities: Normal range of motion.     Mental Status: Normal mood and affect. Normal behavior. Normal judgment and thought content.   Urinalysis:      Assessment and Plan:  Pregnancy: G2P0010 at [redacted]w[redacted]d  1. Encounter for supervision of normal first pregnancy in third trimester Routine care. cx unchanged from mau visit - Strep Gp B NAA - GC/Chlamydia probe amp ([place])not at [facility]  2. Fetal renal anomaly, single gestation Has f/u u/s. Let  peds know at delivery   Preterm labor symptoms and general obstetric precautions including but not limited to vaginal bleeding, contractions, leaking of fluid and fetal movement were reviewed in detail with the patient. Please refer to After Visit Summary for other counseling recommendations.  Return in about 1 week (around 03/23/2020) for in person or virtual, md or app.   [place] Bing, MD

## 2020-03-17 LAB — GC/CHLAMYDIA PROBE AMP (~~LOC~~) NOT AT ARMC
Chlamydia: NEGATIVE
Comment: NEGATIVE
Comment: NORMAL
Neisseria Gonorrhea: NEGATIVE

## 2020-03-18 LAB — STREP GP B NAA: Strep Gp B NAA: NEGATIVE

## 2020-03-21 ENCOUNTER — Other Ambulatory Visit: Payer: Self-pay

## 2020-03-21 ENCOUNTER — Ambulatory Visit (INDEPENDENT_AMBULATORY_CARE_PROVIDER_SITE_OTHER): Payer: Medicaid Other | Admitting: Obstetrics & Gynecology

## 2020-03-21 ENCOUNTER — Other Ambulatory Visit (HOSPITAL_COMMUNITY)
Admission: RE | Admit: 2020-03-21 | Discharge: 2020-03-21 | Disposition: A | Discharge status: Home / Self Care | Payer: Medicaid Other | Source: Ambulatory Visit | Attending: Obstetrics & Gynecology | Admitting: Obstetrics & Gynecology

## 2020-03-21 VITALS — BP 113/72 | HR 106 | Wt 176.0 lb

## 2020-03-21 DIAGNOSIS — Z3403 Encounter for supervision of normal first pregnancy, third trimester: Secondary | ICD-10-CM

## 2020-03-21 DIAGNOSIS — Z0371 Encounter for suspected problem with amniotic cavity and membrane ruled out: Secondary | ICD-10-CM

## 2020-03-21 DIAGNOSIS — O23593 Infection of other part of genital tract in pregnancy, third trimester: Secondary | ICD-10-CM

## 2020-03-21 DIAGNOSIS — Z3A37 37 weeks gestation of pregnancy: Secondary | ICD-10-CM

## 2020-03-21 DIAGNOSIS — N760 Acute vaginitis: Secondary | ICD-10-CM

## 2020-03-21 MED ORDER — FLUCONAZOLE 150 MG PO TABS
150.0000 mg | ORAL_TABLET | Freq: Once | ORAL | 3 refills | Status: AC
Start: 2020-03-21 — End: 2020-03-21

## 2020-03-21 NOTE — Progress Notes (Signed)
° °  PRENATAL VISIT NOTE  Subjective:  Janet Ryan is a 20 y.o. G2P0010 at [redacted]w[redacted]d being seen today for abnormal vaginal discharge for a few days.  Yellow-green, causes external irritation. 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  Also concerned about possible ruptured membranes.  Contractions: Not present. Vag. Bleeding: None.  Movement: Present. Denies leaking of fluid.   She is currently monitored for the following issues for this high-risk pregnancy and has Encounter for supervision of normal first pregnancy in third trimester; Biological false positive RPR test; Asthma during pregnancy; Fetal renal anomaly, single gestation; and History of syncope on their problem list.  The following portions of the patient's history were reviewed and updated as appropriate: allergies, current medications, past family history, past medical history, past social history, past surgical history and problem list.   Objective:   Vitals:   03/21/20 1007  BP: 113/72  Pulse: (!) 106  Weight: 176 lb (79.8 kg)   Fetal Status: Fetal Heart Rate (bpm): 146   Movement: Present     General:  Alert, oriented and cooperative. Patient is in no acute distress.  Skin: Skin is warm and dry. No rash noted.   Cardiovascular: Normal heart rate noted  Respiratory: Normal respiratory effort, no problems with respiration noted  Abdomen: Soft, gravid, appropriate for gestational age.  Pain/Pressure: Absent     Pelvic: Cervical exam performed in the presence of a chaperone Dilation: Fingertip Effacement (%): Thick Station: Ballotable, Diffuse erythema of labia and inner thighs. Thick yellow-green discharge seen, testing sample obtained. Negative pool and nitrazine.  Extremities: Normal range of motion.     Mental Status: Normal mood and affect. Normal behavior. Normal judgment and thought content.   Assessment and Plan:  Pregnancy: G2P0010 at [redacted]w[redacted]d 1. Vaginitis during pregnancy, third trimester Likely recurrent  yeast +/- BV. Diflucan given (had used cream last week, did not help). Follow up testing sample. - Cervicovaginal ancillary only - fluconazole (DIFLUCAN) 150 MG tablet; Take 1 tablet (150 mg total) by mouth once for 1 dose. Can take additional dose three days later if symptoms persist  Dispense: 1 tablet; Refill: 3  2. Encounter for suspected premature rupture of membranes, with rupture of membranes not found Patient reassured.  3. [redacted] weeks gestation of pregnancy 4. Encounter for supervision of normal first pregnancy in third trimester Negative GBS and GC/Chlam.  Labor symptoms and general obstetric precautions including but not limited to vaginal bleeding, contractions, leaking of fluid and fetal movement were reviewed in detail with the patient. Please refer to After Visit Summary for other counseling recommendations.   Return in about 1 week (around 03/28/2020) for OFFICE OB VISIT (MD or APP).  Future Appointments  Date Time Provider Department Center  03/24/2020 11:00 AM [facility] NURSE [facility]  03/24/2020 11:15 AM [facility]  03/31/2020 10:50 AM Janne Napoleon, NP [facility]  04/06/2020  1:00 PM Calli Bashor, Jethro Bastos, MD [facility]    Jaynie Collins, MD

## 2020-03-21 NOTE — Patient Instructions (Signed)
Return to office for any scheduled appointments. Call the office or go to the MAU at [facility] [place] if:  You begin to have strong, frequent contractions  Your water breaks.  Sometimes it is a big gush of fluid, sometimes it is just a trickle that keeps getting your panties wet or running down your legs  You have vaginal bleeding.  It is normal to have a small amount of spotting if your cervix was checked.   You do not feel your baby moving like normal.  If you do not, get something to eat and drink and lay down and focus on feeling your baby move.   If your baby is still not moving like normal, you should call the office or go to MAU.  Any other obstetric concerns.   Signs and Symptoms of Labor Labor is the body's natural process of moving the baby and the placenta out of the uterus. The process of labor usually starts when the baby is full-term, between 37 and 40 weeks of pregnancy. Signs and symptoms that you are close to going into labor As your body prepares for labor and the birth of your baby, you may notice the following symptoms in the weeks and days before true labor starts:  Passing a small amount of thick, bloody mucus from your vagina. This is called normal bloody show or losing your mucus plug. This may happen more than a week before labor begins, or right before labor begins, as the opening of the cervix starts to widen (dilate). For some women, the entire mucus plug passes at once. For others, pieces of the mucus plug may gradually pass over several days.  Your baby moving (dropping) lower in your pelvis to get into position for birth (lightening). When this happens, you may feel more pressure on your bladder and pelvic bone and less pressure on your ribs. This may make it easier to breathe. It may also cause you to need to urinate more often and have problems with bowel movements.  Having "practice contractions," also called Braxton Hicks contractions  or false labor. These occur at irregular (unevenly spaced) intervals that are more than 10 minutes apart. False labor contractions are common after exercise or sexual activity. They will stop if you change position, rest, or drink fluids. These contractions are usually mild and do not get stronger over time. They may feel like: ? A backache or back pain. ? Mild cramps, similar to menstrual cramps. ? Tightening or pressure in your abdomen. Other early symptoms include:  Nausea or loss of appetite.  Diarrhea.  Having a sudden burst of energy, or feeling very tired.  Mood changes.  Having trouble sleeping.   Signs and symptoms that labor has begun Signs that you are in labor may include:  Having contractions that come at regular (evenly spaced) intervals and increase in intensity. This may feel like more intense tightening or pressure in your abdomen that moves to your back. ? Contractions may also feel like rhythmic pain in your upper thighs or back that comes and goes at regular intervals. ? For first-time mothers, this change in intensity of contractions often occurs at a more gradual pace. ? Women who have given birth before may notice a more rapid progression of contraction changes.  Feeling pressure in the vaginal area.  Your water breaking (rupture of membranes). This is when the sac of fluid that surrounds your baby breaks. Fluid leaking from your vagina may be   clear or blood-tinged. Labor usually starts within 24 hours of your water breaking, but it may take longer to begin. ? Some women may feel a sudden gush of fluid. ? Others notice that their underwear repeatedly becomes damp. Follow these instructions at home:  When labor starts, or if your water breaks, call your health care provider or nurse care line. Based on your situation, they will determine when you should go in for an exam.  During early labor, you may be able to rest and manage symptoms at home. Some strategies to  try at home include: ? Breathing and relaxation techniques. ? Taking a warm bath or shower. ? Listening to music. ? Using a heating pad on the lower back for pain. If you are directed to use heat:  Place a towel between your skin and the heat source.  Leave the heat on for 20-30 minutes.  Remove the heat if your skin turns bright red. This is especially important if you are unable to feel pain, heat, or cold. You may have a greater risk of getting burned.   Contact a health care provider if:  Your labor has started.  Your water breaks. Get help right away if:  You have painful, regular contractions that are 5 minutes apart or less.  Labor starts before you are [redacted] weeks along in your pregnancy.  You have a fever.  You have bright red blood coming from your vagina.  You do not feel your baby moving.  You have a severe headache with or without vision problems.  You have severe nausea, vomiting, or diarrhea.  You have chest pain or shortness of breath. These symptoms may represent a serious problem that is an emergency. Do not wait to see if the symptoms will go away. Get medical help right away. Call your local emergency services (911 in the [place] Do not drive yourself to the hospital. Summary  Labor is your body's natural process of moving your baby and the placenta out of your uterus.  The process of labor usually starts when your baby is full-term, between 37 and 40 weeks of pregnancy.  When labor starts, or if your water breaks, call your health care provider or nurse care line. Based on your situation, they will determine when you should go in for an exam. This information is not intended to replace advice given to you by your health care provider. Make sure you discuss any questions you have with your health care provider. Document Revised: 11/20/2019 Document Reviewed: 11/20/2019 Elsevier Patient Education  2021 Elsevier Inc.  

## 2020-03-22 ENCOUNTER — Encounter: Payer: Self-pay | Admitting: Obstetrics & Gynecology

## 2020-03-22 DIAGNOSIS — N760 Acute vaginitis: Secondary | ICD-10-CM | POA: Insufficient documentation

## 2020-03-22 LAB — CERVICOVAGINAL ANCILLARY ONLY
Bacterial Vaginitis (gardnerella): POSITIVE — AB
Candida Glabrata: NEGATIVE
Candida Vaginitis: POSITIVE — AB
Comment: NEGATIVE
Comment: NEGATIVE
Comment: NEGATIVE
Comment: NEGATIVE
Trichomonas: NEGATIVE

## 2020-03-22 MED ORDER — METRONIDAZOLE 500 MG PO TABS: 500.0000 mg | ORAL_TABLET | Freq: Two times a day (BID) | ORAL | 0 refills | Status: AC

## 2020-03-22 NOTE — Addendum Note (Signed)
Addended by: Jaynie Collins A on: 03/22/2020 09:09 PM   Modules accepted: Orders

## 2020-03-23 ENCOUNTER — Encounter: Payer: Medicaid Other | Admitting: Obstetrics and Gynecology

## 2020-03-24 ENCOUNTER — Other Ambulatory Visit: Payer: Self-pay

## 2020-03-24 ENCOUNTER — Ambulatory Visit: Payer: Medicaid Other | Admitting: *Deleted

## 2020-03-24 ENCOUNTER — Ambulatory Visit: Payer: Medicaid Other | Attending: Obstetrics and Gynecology

## 2020-03-24 ENCOUNTER — Encounter: Payer: Self-pay | Admitting: *Deleted

## 2020-03-24 DIAGNOSIS — O35EXX0 Maternal care for other (suspected) fetal abnormality and damage, fetal genitourinary anomalies, not applicable or unspecified: Secondary | ICD-10-CM

## 2020-03-24 DIAGNOSIS — O99519 Diseases of the respiratory system complicating pregnancy, unspecified trimester: Secondary | ICD-10-CM | POA: Insufficient documentation

## 2020-03-24 DIAGNOSIS — N760 Acute vaginitis: Secondary | ICD-10-CM

## 2020-03-24 DIAGNOSIS — O99891 Other specified diseases and conditions complicating pregnancy: Secondary | ICD-10-CM | POA: Diagnosis not present

## 2020-03-24 DIAGNOSIS — Z363 Encounter for antenatal screening for malformations: Secondary | ICD-10-CM

## 2020-03-24 DIAGNOSIS — O358XX0 Maternal care for other (suspected) fetal abnormality and damage, not applicable or unspecified: Secondary | ICD-10-CM | POA: Diagnosis not present

## 2020-03-24 DIAGNOSIS — O99513 Diseases of the respiratory system complicating pregnancy, third trimester: Secondary | ICD-10-CM | POA: Diagnosis not present

## 2020-03-24 DIAGNOSIS — J45909 Unspecified asthma, uncomplicated: Secondary | ICD-10-CM

## 2020-03-24 DIAGNOSIS — Z3A37 37 weeks gestation of pregnancy: Secondary | ICD-10-CM

## 2020-03-24 DIAGNOSIS — R55 Syncope and collapse: Secondary | ICD-10-CM

## 2020-03-24 DIAGNOSIS — O23593 Infection of other part of genital tract in pregnancy, third trimester: Secondary | ICD-10-CM | POA: Insufficient documentation

## 2020-03-30 NOTE — Progress Notes (Signed)
   PRENATAL VISIT NOTE  Subjective:  Janet Ryan is a 20 y.o. G2P0010 at [redacted]w[redacted]d being seen today for ongoing prenatal care.  She is currently monitored for the following issues for this low-risk pregnancy and has Encounter for supervision of normal first pregnancy in third trimester; Biological false positive RPR test; Asthma during pregnancy; Fetal renal anomaly, single gestation; History of syncope; and Vaginitis in pregnancy in third trimester on their problem list.  Patient reports no complaints.  No contractions. Vag. Bleeding: None.  Movement: Present. Denies leaking of fluid.   The following portions of the patient's history were reviewed and updated as appropriate: allergies, current medications, past family history, past medical history, past social history, past surgical history and problem list.   Objective:   Vitals:   03/31/20 1059  BP: 115/77  Pulse: (!) 102  Weight: 180 lb (81.6 kg)    Fetal Status: Fetal Heart Rate (bpm): 149   Movement: Present     General:  Alert, oriented and cooperative. Patient is in no acute distress.  Skin: Skin is warm and dry. No rash noted.   Cardiovascular: Heart rate102  Respiratory: Normal respiratory effort, no problems with respiration noted  Abdomen: Soft, gravid, appropriate for gestational age.  Pain/Pressure: Absent     Pelvic: Cervical exam deferred        Extremities: Normal range of motion.  Edema: None  Mental Status: Normal mood and affect. Normal behavior. Normal judgment and thought content.   Assessment and Plan:  Pregnancy: G2P0010 at [redacted]w[redacted]d There are no diagnoses linked to this encounter. Term labor symptoms and general obstetric precautions including but not limited to vaginal bleeding, contractions, leaking of fluid and fetal movement were reviewed in detail with the patient. Please refer to After Visit Summary for other counseling recommendations.  Discussed with the patient signs and symptoms of labor. Discussed  going to MAU for any problems such as bleeding, leaking of fluid or labor. Patient voices understanding and agrees with plan. Return in one week.    Future Appointments  Date Time Provider Department Center  04/06/2020  1:00 PM Anyanwu, Jethro Bastos, MD [facility]    Kerrie Buffalo, NP

## 2020-03-31 ENCOUNTER — Encounter: Payer: Medicaid Other | Admitting: Nurse Practitioner

## 2020-03-31 ENCOUNTER — Other Ambulatory Visit: Payer: Self-pay

## 2020-03-31 ENCOUNTER — Ambulatory Visit (INDEPENDENT_AMBULATORY_CARE_PROVIDER_SITE_OTHER): Payer: Medicaid Other | Admitting: Nurse Practitioner

## 2020-03-31 ENCOUNTER — Encounter: Payer: Self-pay | Admitting: Nurse Practitioner

## 2020-03-31 VITALS — BP 115/77 | HR 102 | Wt 180.0 lb

## 2020-03-31 DIAGNOSIS — Z3A38 38 weeks gestation of pregnancy: Secondary | ICD-10-CM

## 2020-03-31 NOTE — Patient Instructions (Signed)

## 2020-04-06 ENCOUNTER — Other Ambulatory Visit: Payer: Self-pay

## 2020-04-06 ENCOUNTER — Encounter: Payer: Self-pay | Admitting: *Deleted

## 2020-04-06 ENCOUNTER — Ambulatory Visit (INDEPENDENT_AMBULATORY_CARE_PROVIDER_SITE_OTHER): Payer: Medicaid Other | Admitting: Obstetrics & Gynecology

## 2020-04-06 ENCOUNTER — Encounter: Payer: Self-pay | Admitting: Obstetrics & Gynecology

## 2020-04-06 VITALS — BP 125/77 | HR 80 | Wt 185.0 lb

## 2020-04-06 DIAGNOSIS — Z3A39 39 weeks gestation of pregnancy: Secondary | ICD-10-CM

## 2020-04-06 DIAGNOSIS — N760 Acute vaginitis: Secondary | ICD-10-CM

## 2020-04-06 DIAGNOSIS — O358XX0 Maternal care for other (suspected) fetal abnormality and damage, not applicable or unspecified: Secondary | ICD-10-CM

## 2020-04-06 DIAGNOSIS — O23593 Infection of other part of genital tract in pregnancy, third trimester: Secondary | ICD-10-CM

## 2020-04-06 DIAGNOSIS — O35EXX0 Maternal care for other (suspected) fetal abnormality and damage, fetal genitourinary anomalies, not applicable or unspecified: Secondary | ICD-10-CM

## 2020-04-06 DIAGNOSIS — Z3403 Encounter for supervision of normal first pregnancy, third trimester: Secondary | ICD-10-CM

## 2020-04-06 MED ORDER — FLUCONAZOLE 150 MG PO TABS: 150.0000 mg | ORAL_TABLET | Freq: Once | ORAL | 3 refills | Status: AC

## 2020-04-06 NOTE — Progress Notes (Signed)
   PRENATAL VISIT NOTE  Subjective:  Janet Ryan is a 20 y.o. G2P0010 at [redacted]w[redacted]d being seen today for ongoing prenatal care.  She is currently monitored for the following issues for this high-risk pregnancy and has Encounter for supervision of normal first pregnancy in third trimester; Biological false positive RPR test; Asthma during pregnancy; Fetal renal anomaly, single gestation; History of syncope; and Vaginitis in pregnancy in third trimester on their problem list.  Patient reports continued vulvar irritation.  Was treated for yeast vaginitis, then finished antibiotics for BV. Now reports yeast infection again, wants medication.  Contractions: Irritability. Vag. Bleeding: None.  Movement: Present. Denies leaking of fluid.   The following portions of the patient's history were reviewed and updated as appropriate: allergies, current medications, past family history, past medical history, past social history, past surgical history and problem list.   Objective:   Vitals:   04/06/20 1308  BP: 125/77  Pulse: 80  Weight: 185 lb (83.9 kg)    Fetal Status: Fetal Heart Rate (bpm): 147 Fundal Height: 40 cm Movement: Present  Presentation: Vertex  General:  Alert, oriented and cooperative. Patient is in no acute distress.  Skin: Skin is warm and dry. No rash noted.   Cardiovascular: Normal heart rate noted  Respiratory: Normal respiratory effort, no problems with respiration noted  Abdomen: Soft, gravid, appropriate for gestational age.  Pain/Pressure: Absent     Pelvic: Cervical exam performed in the presence of a chaperone Dilation: 1 Effacement (%): Thick Station: Ballotable  Thick, white discharge noted with vulvar erythema  Extremities: Normal range of motion.  Edema: None  Mental Status: Normal mood and affect. Normal behavior. Normal judgment and thought content.   Assessment and Plan:  Pregnancy: G2P0010 at [redacted]w[redacted]d 1. Vaginitis in pregnancy in third trimester Diflucan prescribed  again, continue to monitor. - fluconazole (DIFLUCAN) 150 MG tablet; Take 1 tablet (150 mg total) by mouth once for 1 dose. Can take additional dose three days later if symptoms persist  Dispense: 1 tablet; Refill: 3  2. Fetal renal anomaly, single gestation Postnatal follow up indicated  3. [redacted] weeks gestation of pregnancy 4. Encounter for supervision of normal first pregnancy in third trimester She desires IOL at 40 weeks. Discussed IOL at 40 weeks, she was told she can go up to 41 weeks to maximize chances of spontaneous labor, also talked about need for post term BPP.  She still desired IOL at 40 weeks.  Risks and benefits of induction were reviewed, including failure of method, prolonged labor, need for further intervention, risk of cesarean section.  Patient understand these risks and wish to proceed. All questions answered.  Induction of labor scheduled on midnight 04/11/2020, orders have been signed and held. Offered outpatient foley placement for cervical ripening, she will consider this, information was given to her to review at home. She was told to expect a call from [facility] L[facility]D with further instructions about her pre-admission COVID screening and any further instructions about the induction of labor.   Term labor symptoms and general obstetric precautions including but not limited to vaginal bleeding, contractions, leaking of fluid and fetal movement were reviewed in detail with the patient. Please refer to After Visit Summary for other counseling recommendations.   Return for Postpartum check.  Future Appointments  Date Time Provider Department Center  04/11/2020 12:00 AM [facility] SCHED ROOM [facility] None  05/11/2020  2:15 PM Maryama Kuriakose, Jethro Bastos, MD [facility]    Jaynie Collins, MD

## 2020-04-06 NOTE — Patient Instructions (Addendum)
You will be called by [facility] about instructions about COVID testing before admission.   Return to office for any scheduled appointments. Call the office or go to the MAU at [facility] if:  You begin to have strong, frequent contractions  Your water breaks.  Sometimes it is a big gush of fluid, sometimes it is just a trickle that keeps getting your panties wet or running down your legs  You have vaginal bleeding.  It is normal to have a small amount of spotting if your cervix was checked.   You do not feel your baby moving like normal.  If you do not, get something to eat and drink and lay down and focus on feeling your baby move.   If your baby is still not moving like normal, you should call the office or go to MAU.  Any other obstetric concerns.  OUTPATIENT FOLEY BULB INDUCTION OF LABOR:  Information Sheet for Mothers and Family               What's a Foley Bulb Induction? A Foley bulb induction is a procedure where your provider inserts a catheter into your cervix. Once inside your womb, your provider inflates the balloon with a saline solution.   This puts pressure on your cervix and encourages dilation. The catheter falls out once your cervix dilates to 3-4 centimeters.     With any procedure, it's important that you know what to expect. The insertion of a Foley catheter can be a bit uncomfortable, and some women experience sharp pelvic pain. The pain may subside once the catheter is in place. You may experience some cramping when the Foley catheter is in place.  This is normal.     GO TO THE MATERNITY ADMISSIONS UNIT FOR THE FOLLOWING:  Heavy vaginal bleeding  Rupture of membranes (fluid that wets your underwear)  Painful uterine contractions every 5 minutes or less  Severe abdominal discomfort  Decreased movement of the baby   Labor Induction Labor induction is when steps are taken to cause a pregnant woman to begin the labor process. Most  women go into labor on their own between 37 weeks and 42 weeks of pregnancy. When this does not happen, or when there is a medical need for labor to begin, steps may be taken to induce, or bring on, labor. Labor induction causes a pregnant woman's uterus to contract. It also causes the cervix to soften (ripen), open (dilate), and thin out. Usually, labor is not induced before 39 weeks of pregnancy unless there is a medical reason to do so. When is labor induction considered? Labor induction may be right for you if:  Your pregnancy lasts longer than 41 to 42 weeks.  Your placenta is separating from your uterus (placental abruption).  You have a rupture of membranes and your labor does not begin.  You have health problems, like diabetes or high blood pressure (preeclampsia) during your pregnancy.  Your baby has stopped growing or does not have enough amniotic fluid. Before labor induction begins, your health care provider will consider the following factors:  Your medical condition and the baby's condition.  How many weeks you have been pregnant.  How mature the baby's lungs are.  The condition of your cervix.  The position of the baby.  The size of your birth canal. Tell a health care provider about:  Any allergies you have.  All medicines you are taking, including vitamins, herbs, eye drops, creams,  and over-the-counter medicines.  Any problems you or your family members have had with anesthetic medicines.  Any surgeries you have had.  Any blood disorders you have.  Any medical conditions you have. What are the risks? Generally, this is a safe procedure. However, problems may occur, including:  Failed induction.  Changes in fetal heart rate, such as being too high, too low, or irregular (erratic).  Infection in the mother or the baby.  Increased risk of having a cesarean delivery.  Breaking off (abruption) of the placenta from the uterus. This is rare.  Rupture of  the uterus. This is very rare.  Your baby could fail to get enough blood flow or oxygen. This can be life-threatening. When induction is needed for medical reasons, the benefits generally outweigh the risks. What happens during the procedure? During the procedure, your health care provider will use one of these methods to induce labor:  Stripping the membranes. In this method, the amniotic sac tissue is gently separated from the cervix. This causes the following to happen: ? Your cervix stretches, which in turn causes the release of prostaglandins. ? Prostaglandins induce labor and cause the uterus to contract. ? This procedure is often done in an office visit. You will be sent home to wait for contractions to begin.  Prostaglandin medicine. This medicine starts contractions and causes the cervix to dilate and ripen. This can be taken by mouth (orally) or by being inserted into the vagina (suppository).  Inserting a small, thin tube (catheter) with a balloon into the vagina and then expanding the balloon with water to dilate the cervix.  Breaking the water. In this method, a small instrument is used to make a small hole in the amniotic sac. This eventually causes the amniotic sac to break. Contractions should begin within a few hours.  Medicine to trigger or strengthen contractions. This medicine is given through an IV that is inserted into a vein in your arm. This procedure may vary among health care providers and hospitals.   Where to find more information  March of [employer]: www.marchofdimes.org  [employer] of [employer]stetricians and Gynecologists: www.acog.org Summary  Labor induction causes a pregnant woman's uterus to contract. It also causes the cervix to soften (ripen), open (dilate), and thin out.  Labor is usually not induced before 39 weeks of pregnancy unless there is a medical reason to do so.  When induction is needed for medical reasons, the benefits generally outweigh  the risks.  Talk with your health care provider about which methods of labor induction are right for you. This information is not intended to replace advice given to you by your health care provider. Make sure you discuss any questions you have with your health care provider. Document Revised: 11/11/2019 Document Reviewed: 11/11/2019 Elsevier Patient Education  2021 [employer].

## 2020-04-07 ENCOUNTER — Encounter (HOSPITAL_COMMUNITY): Payer: Self-pay | Admitting: *Deleted

## 2020-04-07 ENCOUNTER — Telehealth (HOSPITAL_COMMUNITY): Payer: Self-pay | Admitting: *Deleted

## 2020-04-07 NOTE — Telephone Encounter (Signed)
Preadmission screen  

## 2020-04-09 ENCOUNTER — Other Ambulatory Visit: Payer: Self-pay | Admitting: Advanced Practice Midwife

## 2020-04-10 ENCOUNTER — Other Ambulatory Visit: Payer: Self-pay

## 2020-04-10 ENCOUNTER — Ambulatory Visit (INDEPENDENT_AMBULATORY_CARE_PROVIDER_SITE_OTHER): Payer: Medicaid Other | Admitting: Obstetrics and Gynecology

## 2020-04-10 ENCOUNTER — Other Ambulatory Visit (HOSPITAL_COMMUNITY): Payer: Medicaid Other

## 2020-04-10 ENCOUNTER — Other Ambulatory Visit
Admission: RE | Admit: 2020-04-10 | Discharge: 2020-04-10 | Disposition: A | Discharge status: Home / Self Care | Payer: Medicaid Other | Source: Ambulatory Visit | Attending: Obstetrics and Gynecology | Admitting: Obstetrics and Gynecology

## 2020-04-10 VITALS — BP 110/72 | HR 93 | Wt 183.0 lb

## 2020-04-10 DIAGNOSIS — J45909 Unspecified asthma, uncomplicated: Secondary | ICD-10-CM

## 2020-04-10 DIAGNOSIS — Z3403 Encounter for supervision of normal first pregnancy, third trimester: Secondary | ICD-10-CM

## 2020-04-10 DIAGNOSIS — O35EXX0 Maternal care for other (suspected) fetal abnormality and damage, fetal genitourinary anomalies, not applicable or unspecified: Secondary | ICD-10-CM

## 2020-04-10 DIAGNOSIS — O99519 Diseases of the respiratory system complicating pregnancy, unspecified trimester: Secondary | ICD-10-CM

## 2020-04-10 DIAGNOSIS — Z3A40 40 weeks gestation of pregnancy: Secondary | ICD-10-CM

## 2020-04-10 DIAGNOSIS — Z20822 Contact with and (suspected) exposure to covid-19: Secondary | ICD-10-CM | POA: Diagnosis not present

## 2020-04-10 DIAGNOSIS — Z01812 Encounter for preprocedural laboratory examination: Secondary | ICD-10-CM | POA: Diagnosis present

## 2020-04-10 DIAGNOSIS — O358XX0 Maternal care for other (suspected) fetal abnormality and damage, not applicable or unspecified: Secondary | ICD-10-CM

## 2020-04-10 DIAGNOSIS — R768 Other specified abnormal immunological findings in serum: Secondary | ICD-10-CM

## 2020-04-10 LAB — SARS CORONAVIRUS 2 (TAT 6-24 HRS): SARS Coronavirus 2: NEGATIVE

## 2020-04-10 NOTE — Progress Notes (Signed)
Prenatal Visit Note Date: 04/10/2020 Clinic: [facility]  Subjective:  Janet Ryan is a 20 y.o. G2P0010 at [redacted]w[redacted]d being seen today for ongoing prenatal care.  She is currently monitored for the following issues for this low-risk pregnancy and has Encounter for supervision of normal first pregnancy in third trimester; Biological false positive RPR test; Asthma during pregnancy; Fetal renal anomaly, single gestation; History of syncope; and Vaginitis in pregnancy in third trimester on their problem list.  Patient reports no complaints.   Contractions: Irritability. Vag. Bleeding: None.  Movement: Present. Denies leaking of fluid.   The following portions of the patient's history were reviewed and updated as appropriate: allergies, current medications, past family history, past medical history, past social history, past surgical history and problem list. Problem list updated.  Objective:   Vitals:   04/10/20 1309  BP: 110/72  Pulse: 93  Weight: 183 lb (83 kg)    Fetal Status: Fetal Heart Rate (bpm): NST   Movement: Present     General:  Alert, oriented and cooperative. Patient is in no acute distress.  Skin: Skin is warm and dry. No rash noted.   Cardiovascular: Normal heart rate noted  Respiratory: Normal respiratory effort, no problems with respiration noted  Abdomen: Soft, gravid, appropriate for gestational age. Pain/Pressure: Absent     Pelvic:  3.5/70/-2/soft/anterior-->membranes stripped        Extremities: Normal range of motion.  Edema: None  Mental Status: Normal mood and affect. Normal behavior. Normal judgment and thought content.   Urinalysis:      NST and AFI, Foley Bulb Insertion and NST Visit Reactive 145 baseline, +accels, no decel, mod variability Rare UCs, +irritability x 62m  AFI: not done due to cx exam  Foley bulb: not placed due to cx exam   Assessment and Plan:  Pregnancy: G2P0010 at [redacted]w[redacted]d  1. Asthma during  pregnancy Doing well on no meds  2. Biological false positive RPR test F/u L&D lab  3. Fetal renal anomaly, single gestation Right sided. Left peds know at delivery  4. Encounter for supervision of normal first pregnancy in third trimester Patient for IOL tonight. cytotec dose or start with pitocin GBS neg  Term labor symptoms and general obstetric precautions including but not limited to vaginal bleeding, contractions, leaking of fluid and fetal movement were reviewed in detail with the patient. Please refer to After Visit Summary for other counseling recommendations.  Return in about 5 weeks (around 05/15/2020) for 5-6wk pp in person visit.   [place], MD

## 2020-04-11 ENCOUNTER — Encounter (HOSPITAL_COMMUNITY): Payer: Self-pay | Admitting: Obstetrics & Gynecology

## 2020-04-11 ENCOUNTER — Inpatient Hospital Stay (HOSPITAL_COMMUNITY): Payer: Medicaid Other | Admitting: Anesthesiology

## 2020-04-11 ENCOUNTER — Inpatient Hospital Stay (HOSPITAL_COMMUNITY): Payer: Medicaid Other

## 2020-04-11 ENCOUNTER — Inpatient Hospital Stay (HOSPITAL_COMMUNITY)
Admission: AD | Admit: 2020-04-11 | Discharge: 2020-04-13 | DRG: 807 | Disposition: A | Discharge status: Home / Self Care | Payer: Medicaid Other | Attending: Obstetrics and Gynecology | Admitting: Obstetrics and Gynecology

## 2020-04-11 ENCOUNTER — Other Ambulatory Visit: Payer: Self-pay

## 2020-04-11 DIAGNOSIS — Z3A40 40 weeks gestation of pregnancy: Secondary | ICD-10-CM | POA: Diagnosis not present

## 2020-04-11 DIAGNOSIS — Z20822 Contact with and (suspected) exposure to covid-19: Secondary | ICD-10-CM | POA: Diagnosis present

## 2020-04-11 DIAGNOSIS — J45909 Unspecified asthma, uncomplicated: Secondary | ICD-10-CM | POA: Diagnosis present

## 2020-04-11 DIAGNOSIS — Z87898 Personal history of other specified conditions: Secondary | ICD-10-CM

## 2020-04-11 DIAGNOSIS — O9952 Diseases of the respiratory system complicating childbirth: Secondary | ICD-10-CM | POA: Diagnosis present

## 2020-04-11 DIAGNOSIS — O480 Post-term pregnancy: Principal | ICD-10-CM | POA: Diagnosis present

## 2020-04-11 DIAGNOSIS — O358XX0 Maternal care for other (suspected) fetal abnormality and damage, not applicable or unspecified: Secondary | ICD-10-CM | POA: Diagnosis present

## 2020-04-11 DIAGNOSIS — O99519 Diseases of the respiratory system complicating pregnancy, unspecified trimester: Secondary | ICD-10-CM | POA: Diagnosis present

## 2020-04-11 DIAGNOSIS — R768 Other specified abnormal immunological findings in serum: Secondary | ICD-10-CM | POA: Diagnosis present

## 2020-04-11 DIAGNOSIS — Z3403 Encounter for supervision of normal first pregnancy, third trimester: Secondary | ICD-10-CM

## 2020-04-11 DIAGNOSIS — O35EXX0 Maternal care for other (suspected) fetal abnormality and damage, fetal genitourinary anomalies, not applicable or unspecified: Secondary | ICD-10-CM

## 2020-04-11 LAB — CBC
HCT: 36.3 % (ref 36.0–46.0)
Hemoglobin: 12.6 g/dL (ref 12.0–15.0)
MCH: 31 pg (ref 26.0–34.0)
MCHC: 34.7 g/dL (ref 30.0–36.0)
MCV: 89.2 fL (ref 80.0–100.0)
Platelets: 240 10*3/uL (ref 150–400)
RBC: 4.07 MIL/uL (ref 3.87–5.11)
RDW: 14 % (ref 11.5–15.5)
WBC: 7 10*3/uL (ref 4.0–10.5)
nRBC: 0 % (ref 0.0–0.2)

## 2020-04-11 LAB — TYPE AND SCREEN
ABO/RH(D): O POS
Antibody Screen: NEGATIVE

## 2020-04-11 LAB — RPR: RPR Ser Ql: NONREACTIVE

## 2020-04-11 MED ORDER — FLEET ENEMA 7-19 GM/118ML RE ENEM: 1.0000 | ENEMA | Freq: Every day | RECTAL | Status: DC | PRN

## 2020-04-11 MED ORDER — LIDOCAINE HCL (PF) 1 % IJ SOLN
INTRAMUSCULAR | Status: DC | PRN
  Administered 2020-04-11 (×2): 4 mL via EPIDURAL

## 2020-04-11 MED ORDER — OXYTOCIN-SODIUM CHLORIDE 30-0.9 UT/500ML-% IV SOLN
2.5000 [IU]/h | INTRAVENOUS | Status: DC
  Filled 2020-04-11: qty 500

## 2020-04-11 MED ORDER — LACTATED RINGERS IV SOLN: INTRAVENOUS | Status: DC

## 2020-04-11 MED ORDER — SOD CITRATE-CITRIC ACID 500-334 MG/5ML PO SOLN: 30.0000 mL | ORAL | Status: DC | PRN

## 2020-04-11 MED ORDER — MISOPROSTOL 25 MCG QUARTER TABLET: 25.0000 ug | ORAL_TABLET | ORAL | Status: DC | PRN

## 2020-04-11 MED ORDER — BENZOCAINE-MENTHOL 20-0.5 % EX AERO: 1.0000 "application " | INHALATION_SPRAY | CUTANEOUS | Status: DC | PRN

## 2020-04-11 MED ORDER — OXYCODONE-ACETAMINOPHEN 5-325 MG PO TABS
2.0000 | ORAL_TABLET | ORAL | Status: DC | PRN
Start: 2020-04-11 — End: 2020-04-11

## 2020-04-11 MED ORDER — FENTANYL-BUPIVACAINE-NACL 0.5-0.125-0.9 MG/250ML-% EP SOLN
12.0000 mL/h | EPIDURAL | Status: DC | PRN
  Administered 2020-04-11: 12 mL/h via EPIDURAL
  Filled 2020-04-11: qty 250

## 2020-04-11 MED ORDER — LACTATED RINGERS IV SOLN: 500.0000 mL | Freq: Once | INTRAVENOUS | Status: DC

## 2020-04-11 MED ORDER — ONDANSETRON HCL 4 MG/2ML IJ SOLN: 4.0000 mg | Freq: Four times a day (QID) | INTRAMUSCULAR | Status: DC | PRN

## 2020-04-11 MED ORDER — OXYTOCIN-SODIUM CHLORIDE 30-0.9 UT/500ML-% IV SOLN: 1.0000 m[IU]/min | INTRAVENOUS | Status: DC

## 2020-04-11 MED ORDER — SENNOSIDES-DOCUSATE SODIUM 8.6-50 MG PO TABS
2.0000 | ORAL_TABLET | Freq: Every day | ORAL | Status: DC
  Administered 2020-04-12 – 2020-04-13 (×2): 2 via ORAL
  Filled 2020-04-11 (×2): qty 2

## 2020-04-11 MED ORDER — ONDANSETRON HCL 4 MG PO TABS: 4.0000 mg | ORAL_TABLET | ORAL | Status: DC | PRN

## 2020-04-11 MED ORDER — FENTANYL CITRATE (PF) 100 MCG/2ML IJ SOLN: 50.0000 ug | INTRAMUSCULAR | Status: DC | PRN

## 2020-04-11 MED ORDER — EPHEDRINE 5 MG/ML INJ: 10.0000 mg | INTRAVENOUS | Status: DC | PRN

## 2020-04-11 MED ORDER — LIDOCAINE HCL (PF) 1 % IJ SOLN: 30.0000 mL | INTRAMUSCULAR | Status: DC | PRN

## 2020-04-11 MED ORDER — SIMETHICONE 80 MG PO CHEW: 80.0000 mg | CHEWABLE_TABLET | ORAL | Status: DC | PRN

## 2020-04-11 MED ORDER — OXYCODONE-ACETAMINOPHEN 5-325 MG PO TABS: 1.0000 | ORAL_TABLET | ORAL | Status: DC | PRN

## 2020-04-11 MED ORDER — OXYTOCIN-SODIUM CHLORIDE 30-0.9 UT/500ML-% IV SOLN
1.0000 m[IU]/min | INTRAVENOUS | Status: DC
  Administered 2020-04-11: 2 m[IU]/min via INTRAVENOUS
  Filled 2020-04-11: qty 500

## 2020-04-11 MED ORDER — TETANUS-DIPHTH-ACELL PERTUSSIS 5-2.5-18.5 LF-MCG/0.5 IM SUSY: 0.5000 mL | PREFILLED_SYRINGE | Freq: Once | INTRAMUSCULAR | Status: DC

## 2020-04-11 MED ORDER — PHENYLEPHRINE 40 MCG/ML (10ML) SYRINGE FOR IV PUSH (FOR BLOOD PRESSURE SUPPORT): 80.0000 ug | PREFILLED_SYRINGE | INTRAVENOUS | Status: DC | PRN

## 2020-04-11 MED ORDER — ACETAMINOPHEN 325 MG PO TABS: 650.0000 mg | ORAL_TABLET | ORAL | Status: DC | PRN

## 2020-04-11 MED ORDER — ERYTHROMYCIN 5 MG/GM OP OINT
TOPICAL_OINTMENT | OPHTHALMIC | Status: AC
  Filled 2020-04-11: qty 1

## 2020-04-11 MED ORDER — DIBUCAINE (PERIANAL) 1 % EX OINT: 1.0000 "application " | TOPICAL_OINTMENT | CUTANEOUS | Status: DC | PRN

## 2020-04-11 MED ORDER — ZOLPIDEM TARTRATE 5 MG PO TABS: 5.0000 mg | ORAL_TABLET | Freq: Every evening | ORAL | Status: DC | PRN

## 2020-04-11 MED ORDER — EPHEDRINE 5 MG/ML INJ
10.0000 mg | INTRAVENOUS | Status: DC | PRN
  Filled 2020-04-11: qty 10

## 2020-04-11 MED ORDER — PRENATAL MULTIVITAMIN CH
1.0000 | ORAL_TABLET | Freq: Every day | ORAL | Status: DC
  Administered 2020-04-12 – 2020-04-13 (×2): 1 via ORAL
  Filled 2020-04-11 (×2): qty 1

## 2020-04-11 MED ORDER — DIPHENHYDRAMINE HCL 25 MG PO CAPS: 25.0000 mg | ORAL_CAPSULE | Freq: Four times a day (QID) | ORAL | Status: DC | PRN

## 2020-04-11 MED ORDER — DIPHENHYDRAMINE HCL 50 MG/ML IJ SOLN: 12.5000 mg | INTRAMUSCULAR | Status: DC | PRN

## 2020-04-11 MED ORDER — FENTANYL CITRATE (PF) 100 MCG/2ML IJ SOLN: 100.0000 ug | INTRAMUSCULAR | Status: DC | PRN

## 2020-04-11 MED ORDER — WITCH HAZEL-GLYCERIN EX PADS: 1.0000 "application " | MEDICATED_PAD | CUTANEOUS | Status: DC | PRN

## 2020-04-11 MED ORDER — LACTATED RINGERS IV SOLN
500.0000 mL | INTRAVENOUS | Status: DC | PRN
  Administered 2020-04-11: 500 mL via INTRAVENOUS

## 2020-04-11 MED ORDER — ONDANSETRON HCL 4 MG/2ML IJ SOLN: 4.0000 mg | INTRAMUSCULAR | Status: DC | PRN

## 2020-04-11 MED ORDER — TERBUTALINE SULFATE 1 MG/ML IJ SOLN: 0.2500 mg | Freq: Once | INTRAMUSCULAR | Status: DC | PRN

## 2020-04-11 MED ORDER — COCONUT OIL OIL: 1.0000 "application " | TOPICAL_OIL | Status: DC | PRN

## 2020-04-11 MED ORDER — HYDROXYZINE HCL 50 MG PO TABS: 50.0000 mg | ORAL_TABLET | Freq: Four times a day (QID) | ORAL | Status: DC | PRN

## 2020-04-11 MED ORDER — IBUPROFEN 600 MG PO TABS
600.0000 mg | ORAL_TABLET | Freq: Four times a day (QID) | ORAL | Status: DC
  Administered 2020-04-11 – 2020-04-13 (×7): 600 mg via ORAL
  Filled 2020-04-11 (×7): qty 1

## 2020-04-11 MED ORDER — OXYTOCIN BOLUS FROM INFUSION
333.0000 mL | Freq: Once | INTRAVENOUS | Status: AC
  Administered 2020-04-11: 333 mL via INTRAVENOUS

## 2020-04-11 NOTE — Anesthesia Preprocedure Evaluation (Signed)
Anesthesia Evaluation  Patient identified by MRN, date of birth, ID band Patient awake    Reviewed: Allergy & Precautions, Patient's Chart, lab work & pertinent test results  Airway Mallampati: II  TM Distance: >3 FB Neck ROM: Full    Dental no notable dental hx. (+) Teeth Intact   Pulmonary asthma ,    Pulmonary exam normal breath sounds clear to auscultation       Cardiovascular negative cardio ROS Normal cardiovascular exam Rhythm:Regular Rate:Normal     Neuro/Psych negative neurological ROS  negative psych ROS   GI/Hepatic negative GI ROS, Neg liver ROS,   Endo/Other  negative endocrine ROS  Renal/GU Renal disease  negative genitourinary   Musculoskeletal negative musculoskeletal ROS (+)   Abdominal   Peds  Hematology negative hematology ROS (+)   Anesthesia Other Findings   Reproductive/Obstetrics (+) Pregnancy                             Anesthesia Physical Anesthesia Plan  ASA: II  Anesthesia Plan: Epidural   Post-op Pain Management:    Induction:   PONV Risk Score and Plan:   Airway Management Planned: Natural Airway  Additional Equipment:   Intra-op Plan:   Post-operative Plan:   Informed Consent: I have reviewed the patients History and Physical, chart, labs and discussed the procedure including the risks, benefits and alternatives for the proposed anesthesia with the patient or authorized representative who has indicated his/her understanding and acceptance.       Plan Discussed with: Anesthesiologist  Anesthesia Plan Comments:         Anesthesia Quick Evaluation

## 2020-04-11 NOTE — Progress Notes (Signed)
Attempted to reach [facility] to inform them that baby was born. [facility] will notify the ped.

## 2020-04-11 NOTE — Lactation Note (Addendum)
This note was copied from a baby's chart. Lactation Consultation Note  Patient Name: Janet Ryan KZSWF'U Date: 04/11/2020 Reason for consult: L&D Initial assessment;1st time breastfeeding;Term Age:22 hours  ( L&D- LC no charge) P1 term female infant. Mom watch BF videos online and knows how to hand express. LC entered room mom had infant latched across her chest in [place] hold but latch was shallow. Mom reposition infant using the football hold on left breast and infant latched with depth, infant total feeding was 20 minutes. Afterwards mom hand expressed and infant was given 3 mls of colostrum by spoon. LC placed hand pump in room for mom to pre-pump breast due to mom's breast being flat when touch, mom's breast  compresses inward. Mom knows to BF infant according to primal cues: licking , smacking , tasting, rooting and hands in mouth. Mom knows to call  [facility] RN or [facility] if she needs further assistance with latching infant at the breast.    LC discussed infant's input  and output . Mom has personal DEBP that she brought from home. Maternal Data Has patient been taught Hand Expression?: Yes Does the patient have breastfeeding experience prior to this delivery?: No  Feeding Mother's Current Feeding Choice: Breast Milk  LATCH Score Latch: Grasps breast easily, tongue down, lips flanged, rhythmical sucking.  Audible Swallowing: A few with stimulation  Type of Nipple: Flat  Comfort (Breast/Nipple): Soft / non-tender  Hold (Positioning): Assistance needed to correctly position infant at breast and maintain latch.  LATCH Score: 7   Lactation Tools Discussed/Used Tools: Pump Breast pump type: Manual Pump Education: Setup, frequency, and cleaning;Milk Storage Reason for Pumping: LC informed MBU RN she will place hand pump in room, RN will explain how to use, mom understands to pre-pump breast to help evert nipple shaft out more to help infant have a deeper latch. Pumping  frequency: Pre-pump prior to latching infant at the breast.  Interventions Interventions: Breast feeding basics reviewed;Assisted with latch;Skin to skin;Breast massage;Hand express;Pre-pump if needed;Expressed milk;Position options;Support pillows;Adjust position;Hand pump;Breast compression  Discharge Pump: Manual WIC Program: No  Consult Status Consult Status: Follow-up Date: 04/12/20 Follow-up type: In-patient    Danelle Earthly 04/11/2020, 7:52 PM

## 2020-04-11 NOTE — H&P (Signed)
OBSTETRIC ADMISSION HISTORY AND PHYSICAL  Janet Ryan is a 20 y.o. female G2P0010 with IUP at [redacted]w[redacted]d by LMP presenting for elective IOL. She reports +FMs, No LOF, no VB, no blurry vision, headaches or peripheral edema, and RUQ pain.  She plans on breast feeding. She requests POPs for birth control.  She received her prenatal care at [facility]   Dating: By LMP --->  Estimated Date of Delivery: 04/10/20  Sono:  @[redacted]w[redacted]d , CWD, normal anatomy, cephalic presentation, 3457g, EFW  Prenatal History/Complications:  - asthma (no medications) - h/o syncope in pregnancy - false positive RPR - fetal renal anomaly - late transfer of care  Past Medical History: Past Medical History:  Diagnosis Date  . Asthma     Past Surgical History: Past Surgical History:  Procedure Laterality Date  . WISDOM TOOTH EXTRACTION  2016    Obstetrical History: OB History    Gravida  2   Para      Term      Preterm      AB  1   Living        SAB  1   IAB      Ectopic      Multiple      Live Births              Social History Social History   Socioeconomic History  . Marital status: Single    Spouse name: Not on file  . Number of children: Not on file  . Years of education: Not on file  . Highest education level: Not on file  Occupational History  . Occupation: unemployed  Tobacco Use  . Smoking status: Never Smoker  . Smokeless tobacco: Never Used  Vaping Use  . Vaping Use: Never used  Substance and Sexual Activity  . Alcohol use: Never  . Drug use: Never  . Sexual activity: Not Currently  Other Topics Concern  . Not on file  Social History Narrative  . Not on file   Social Determinants of Health   Financial Resource Strain: Not on file  Food Insecurity: Not on file  Transportation Needs: Not on file  Physical Activity: Not on file  Stress: Not on file  Social Connections: Not on file    Family History: Family History  Problem Relation Age of Onset  .  Hypertension Mother   . Hypertension Maternal Grandmother     Allergies: Allergies  Allergen Reactions  . Nasonex [Mometasone] Shortness Of Breath  . Shellfish Allergy     Medications Prior to Admission  Medication Sig Dispense Refill Last Dose  . aspirin 81 MG chewable tablet Chew by mouth daily.     2017 Bandages & Supports (MEDICAL COMPRESSION STOCKINGS) MISC 2 each by Does not apply route continuous as needed. 2 each 0   . ferrous gluconate (FERGON) 324 MG tablet Take 1 tablet (324 mg total) by mouth every other day. 30 tablet 3   . potassium chloride 20 MEQ TBCR Take 20 mEq by mouth 2 (two) times daily for 5 days. 10 tablet 0   . Prenatal Vit-Fe Fumarate-FA (PRENATAL MULTIVITAMIN) TABS tablet Take 1 tablet by mouth daily at 12 noon.        Review of Systems   All systems reviewed and negative except as stated in HPI  There were no vitals taken for this visit. General appearance: alert, cooperative and appears stated age Lungs: normal WOB Heart: regular rate  Abdomen: soft, non-tender Extremities: no  sign of DVT Presentation: cephalic Fetal monitoringBaseline: 150 bpm, Variability: Good {> 6 bpm), Accelerations: Reactive and Decelerations: Absent Uterine activityFrequency: Every 2-3 minutes   Prenatal labs: ABO, Rh:  O positive Antibody:  negative Rubella:  immune RPR: Reactive (12/16 0940)  HBsAg:   negative HIV: Non Reactive (12/16 0942)  GBS: Negative/-- (02/03 1455)  1 hr Glucola wnl Genetic screening  wnl Anatomy US wnl except for mild right pyeloectasis (0.7 cm)  Prenatal Transfer Tool  Maternal Diabetes: No Genetic Screening: Normal Maternal Ultrasounds/Referrals: Normal except for mild right pyeloectasis (0.7cm) Fetal Ultrasounds or other Referrals:  None Maternal Substance Abuse:  No Significant Maternal Medications:  None Significant Maternal Lab Results: Group B Strep negative  Results for orders placed or performed during the hospital  encounter of 04/10/20 (from the past 24 hour(s))  SARS CORONAVIRUS 2 (TAT 6-24 HRS) Nasopharyngeal Nasopharyngeal Swab   Collection Time: 04/10/20  9:19 AM   Specimen: Nasopharyngeal Swab  Result Value Ref Range   SARS Coronavirus 2 NEGATIVE NEGATIVE    Patient Active Problem List   Diagnosis Date Noted  . Post term pregnancy over 40 weeks 04/11/2020  . Vaginitis in pregnancy in third trimester 03/22/2020  . History of syncope 03/16/2020  . Fetal renal anomaly, single gestation 03/04/2020  . Asthma during pregnancy 02/10/2020  . Biological false positive RPR test 02/04/2020  . Encounter for supervision of normal first pregnancy in third trimester 01/27/2020    Assessment/Plan:  Janet Ryan is a 20 y.o. G2P0010 at [redacted]w[redacted]d here for elective IOL.  #Labor: Plan for FB + low dose pitocin given initial cervical exam and frequency of contractions. #Pain: TBD per pt request #FWB: Category 1 strip #ID: GBS negative #MOF: breast #MOC: POPs #Circ: n/a #Late Transfer for Prenatal Care: plan for SW consult in postpartum period #Fetal Right Pyeloectasis: peds notified #Asthma: will avoid hemabate  Maddi Collar, Skipper Cliche, MD OB Fellow, [facility] 04/11/2020 12:40 AM

## 2020-04-11 NOTE — Discharge Summary (Addendum)
°  Postpartum Discharge Summary ° °   °Patient Name: Janet Ryan °DOB: 07/04/2000 °MRN: [id number] ° °Date of admission: 04/11/2020 °Delivery date:04/11/2020  °Delivering provider: CRESENZO, VICTOR  °Date of discharge: 04/13/2020 ° °Admitting diagnosis: Post term pregnancy over 40 weeks [O48.0] °Intrauterine pregnancy: [redacted]w[redacted]d     °Secondary diagnosis:  Active Problems: °  Encounter for supervision of normal first pregnancy in third trimester °  Biological false positive RPR test °  Asthma during pregnancy °  Fetal renal anomaly, single gestation °  History of syncope °  Post term pregnancy over 40 weeks ° °Additional problems: None    °Discharge diagnosis: Term Pregnancy Delivered                                              °Post partum procedures:None °Augmentation: AROM, Pitocin and OP Foley °Complications: None ° °Hospital course: Induction of Labor With Vaginal Delivery   °20 y.o. G2P0010 at [redacted]w[redacted]d was admitted to the hospital 04/11/2020 for induction of labor. Indication for induction: Elective at term.  Patient had an uncomplicated labor course as follows: °Membrane Rupture Time/Date: 2:30 AM ,04/11/2020   °Delivery Method:Vaginal, Spontaneous  °Episiotomy: None  °Lacerations:  Labial;Periurethral;2nd degree  °Details of delivery can be found in separate delivery note.  Patient had a routine postpartum course. Patient is discharged home 04/13/20. ° °Newborn Data: °Birth date:04/11/2020  °Birth time:6:49 PM  °Gender:Female  °Living status:Living  °Apgars:8 ,9  °Weight:3946 g (8lb 11.2oz) ° °Magnesium Sulfate received: No °BMZ received: No °Rhophylac:N/A °MMR:N/A °T-DaP:Given prenatally °Flu: No °Transfusion:No ° °Physical exam  °Vitals:  ° 04/12/20 0604 04/12/20 1140 04/12/20 2057 04/13/20 0537  °BP: (!) 92/51 (!) 114/59 119/74 107/80  °Pulse: 65 61 68 82  °Resp: 18 18 18 16  °Temp: 98.4 °F (36.9 °C) 98 °F (36.7 °C) 98.3 °F (36.8 °C) 98.5 °F (36.9 °C)  °TempSrc: Oral Oral Oral Oral  °SpO2:   100% 100%  °Weight:       °Height:      ° °General: alert, cooperative and no distress °Lochia: appropriate °Uterine Fundus: firm °Incision: N/A °DVT Evaluation: No evidence of DVT seen on physical exam. °Labs: °Lab Results  °Component Value Date  ° WBC 7.0 04/11/2020  ° HGB 12.6 04/11/2020  ° HCT 36.3 04/11/2020  ° MCV 89.2 04/11/2020  ° PLT 240 04/11/2020  ° °CMP Latest Ref Rng & Units 02/29/2020  °Glucose 70 - 99 mg/dL 98  °BUN 6 - 20 mg/dL 11  °Creatinine 0.44 - 1.00 mg/dL 0.84  °Sodium 135 - 145 mmol/L 133(L)  °Potassium 3.5 - 5.1 mmol/L 3.2(L)  °Chloride 98 - 111 mmol/L 104  °CO2 22 - 32 mmol/L 15(L)  °Calcium 8.9 - 10.3 mg/dL 8.9  °Total Protein 6.5 - 8.1 g/dL 6.5  °Total Bilirubin 0.3 - 1.2 mg/dL 0.9  °Alkaline Phos 38 - 126 U/L 52  °AST 15 - 41 U/L 32  °ALT 0 - 44 U/L 21  ° °Edinburgh Score: °No flowsheet data found. ° ° °After visit meds:  °Allergies as of 04/13/2020   °   Reactions  ° Nasonex [mometasone] Shortness Of Breath  ° Shellfish Allergy Anaphylaxis  °  °  °Medication List  °  °STOP taking these medications   °aspirin 81 MG chewable tablet °  °fluconazole 150 MG tablet °Commonly known as: DIFLUCAN °  °Medical Compression Stockings Misc °  °  miconazole 2 % vaginal cream Commonly known as: MONISTAT 7   Potassium Chloride ER 20 MEQ Tbcr     TAKE these medications   acetaminophen 325 MG tablet Commonly known as: Tylenol Take 3 tablets (975 mg total) by mouth every 6 (six) hours as needed (for pain scale < 4).   coconut oil Oil Apply 1 application topically as needed.   ferrous gluconate 324 MG tablet Commonly known as: FERGON Take 1 tablet (324 mg total) by mouth every other day.   ibuprofen 600 MG tablet Commonly known as: ADVIL Take 1 tablet (600 mg total) by mouth every 6 (six) hours as needed.   prenatal multivitamin Tabs tablet Take 1 tablet by mouth daily at 12 noon.        Discharge home in stable condition Infant Feeding: Breast Infant Disposition:home with mother Discharge instruction: per  After Visit Summary and Postpartum booklet. Activity: Advance as tolerated. Pelvic rest for 6 weeks.  Diet: routine diet Future Appointments: Future Appointments  Date Time Provider [place]  05/16/2020  3:00 PM Anyanwu, Sallyanne Havers, MD [facility]   Follow up Visit: Please schedule this patient for a In person postpartum visit in 6 weeks with the following provider: Any provider. Additional Postpartum F/U:None  Low risk pregnancy complicated by: None Delivery mode:  Vaginal, Spontaneous  Anticipated Birth Control:  POPs   04/13/2020 Alcus Dad, MD  CNM attestation I have seen and examined this patient and agree with above documentation in the resident's note.   Kori Goins is a 20 y.o. G2P1011 s/p vag del after elective IOL.   Pain is well controlled.  Plan for birth control is considering POPs.  Method of Feeding: breast Her infant has a pending u/s to eval for potential renal anomaly, so she may be rooming-in overnight if the baby is kept as a patient.  PE:  BP 107/80 (BP Location: Left Arm)    Pulse 82    Temp 98.5 F (36.9 C) (Oral)    Resp 16    Ht 5' 7" (1.702 m)    Wt 84.2 kg    SpO2 100%    Breastfeeding Unknown    BMI 29.07 kg/m  Fundus firm  Recent Labs    04/11/20 0041  HGB 12.6  HCT 36.3     Plan: discharge today - postpartum care discussed - f/u clinic in 4 weeks for postpartum visit   Myrtis Ser, CNM 8:01 AM  04/13/2020

## 2020-04-11 NOTE — Anesthesia Procedure Notes (Signed)
Epidural Patient location during procedure: OB Start time: 04/11/2020 8:17 AM End time: 04/11/2020 8:25 AM  Staffing Anesthesiologist: Mal Amabile, MD Performed: anesthesiologist   Preanesthetic Checklist Completed: patient identified, IV checked, site marked, risks and benefits discussed, surgical consent, monitors and equipment checked, pre-op evaluation and timeout performed  Epidural Patient position: sitting Prep: DuraPrep and site prepped and draped Patient monitoring: continuous pulse ox and blood pressure Approach: midline Location: L3-L4 Injection technique: LOR air  Needle:  Needle type: Tuohy  Needle gauge: 17 G Needle length: 9 cm and 9 Needle insertion depth: 6 cm Catheter type: closed end flexible Catheter size: 19 Gauge Catheter at skin depth: 11 cm Test dose: negative and Other  Assessment Events: blood not aspirated, injection not painful, no injection resistance, no paresthesia and negative IV test  Additional Notes Patient identified. Risks and benefits discussed including failed block, incomplete  Pain control, post dural puncture headache, nerve damage, paralysis, blood pressure Changes, nausea, vomiting, reactions to medications-both toxic and allergic and post Partum back pain. All questions were answered. Patient expressed understanding and wished to proceed. Sterile technique was used throughout procedure. Epidural site was Dressed with sterile barrier dressing. No paresthesias, signs of intravascular injection Or signs of intrathecal spread were encountered.  Patient was more comfortable after the epidural was dosed. Please see RN's note for documentation of vital signs and FHR which are stable.

## 2020-04-11 NOTE — Progress Notes (Signed)
LABOR PROGRESS NOTE  Janet Ryan is a 20 y.o. G2P0010 at [redacted]w[redacted]d  admitted for elective IOL.  Pregnancy complicated by Hx asthma, H/O syncope in pregnancy, false positive RPR, fetal renal anomaly, late transfer of care.  Subjective: Patient has no complaints at this time, sitting comfortably in bed.  Reports that she is feeling more contractions.  Objective: BP (!) 92/55   Pulse 62   Temp 98.4 F (36.9 C) (Axillary)   Resp 15   Ht 5\' 7"  (1.702 m)   Wt 84.2 kg   SpO2 99%   BMI 29.07 kg/m  or  Vitals:   04/11/20 1031 04/11/20 1101 04/11/20 1131 04/11/20 1201  BP: 115/73 120/60 (!) 101/53 (!) 92/55  Pulse: 93 71 67 62  Resp: 15     Temp: 98.3 F (36.8 C)   98.4 F (36.9 C)  TempSrc: Axillary   Axillary  SpO2:      Weight:      Height:       Dilation: 6.5 Effacement (%): 70 Cervical Position: Middle Station: -1,0 Presentation: Vertex Exam by:: [email or web address] RN FHT: baseline rate 145, moderate varibility, + acel, - decel Toco: Contractions every approximately 5 minutes, not tracing well.  Labs: Lab Results  Component Value Date   WBC 7.0 04/11/2020   HGB 12.6 04/11/2020   HCT 36.3 04/11/2020   MCV 89.2 04/11/2020   PLT 240 04/11/2020    Patient Active Problem List   Diagnosis Date Noted  . Post term pregnancy over 40 weeks 04/11/2020  . Vaginitis in pregnancy in third trimester 03/22/2020  . History of syncope 03/16/2020  . Fetal renal anomaly, single gestation 03/04/2020  . Asthma during pregnancy 02/10/2020  . Biological false positive RPR test 02/04/2020  . Encounter for supervision of normal first pregnancy in third trimester 01/27/2020    Assessment / Plan: 20 y.o. G2P0010 at [redacted]w[redacted]d here for elective IOL.  Labor: Progressing, Foley bulb out at 645 (3/1) currently on Pitocin at 14. Fetal Wellbeing: Category 1 strip, reassuring Pain Control: Epidural in place Anticipated MOD: Vaginal delivery ID: GBS negative MOF: Breast MOC: POPs Late transfer  of prenatal care: Social work consult and postpartum. Fetal right pyeloectasis: Peds has been notified Asthma: Avoid Hemabate  [redacted]w[redacted]d, MD  PGY-2, [facility]  04/11/2020, 12:18 PM

## 2020-04-12 MED ORDER — OXYCODONE HCL 5 MG PO TABS: 5.0000 mg | ORAL_TABLET | ORAL | Status: DC | PRN

## 2020-04-12 NOTE — Lactation Note (Addendum)
This note was copied from a baby's chart. Lactation Consultation Note  Patient Name: Girl Lee-Anne Flicker GGYIR'S Date: 04/12/2020 Reason for consult: Follow-up assessment;Primapara;1st time breastfeeding;Term Age:22 hours  MOB is a P1. Infant is 21 hours old and was born at [redacted]w[redacted]d. MOB was attempting to feed infant as LC and LC Student arrived. LC Student assisted MOB by helping her to adjust the position of her and the baby and adding pillows. LC Student attempted to latch infant on the right breast in football hold. Infant was unable to latch at this time. LC attempted to latch infant using tea cup method but infant was still unable to sustain a latch at this time. Infant would open her mouth but did not latch to breast. MOB has flat nipples. LC Student talked with MOB about hand expression and was able to hand express 62ml into a spoon which was fed to infant. Infant was relaxed and went to sleep. MOB was provided with a hand pump and instructed on how to put it together, clean it, and store milk. MOB instructed to pre pump to help nipples evert. MOB was educated about skin to skin, feeding baby 8-12 times daily, and following hunger cues. MOB had no further questions. Left room with MOB holding sleeping infant skin to skin.    Feeding Mother's Current Feeding Choice: Breast Milk  LATCH Score Latch: Too sleepy or reluctant, no latch achieved, no sucking elicited.  Audible Swallowing: None  Type of Nipple: Flat  Comfort (Breast/Nipple): Soft / non-tender  Hold (Positioning): Assistance needed to correctly position infant at breast and maintain latch.  LATCH Score: 4   Lactation Tools Discussed/Used Tools: Pump Breast pump type: Manual Pump Education: Setup, frequency, and cleaning;Milk Storage  Interventions Interventions: Breast feeding basics reviewed;Assisted with latch;Adjust position;Hand pump;Skin to skin;Support pillows;Breast massage;Position options;Expressed milk;Hand  express;Pre-pump if needed  Discharge Pump: Manual  Consult Status Consult Status: Follow-up Date: 04/13/20 Follow-up type: In-patient    Hiromi Knodel Katrinka Blazing 04/12/2020, 11:39 AM

## 2020-04-12 NOTE — Lactation Note (Addendum)
This note was copied from a baby's chart. Lactation Consultation Note  Patient Name: Janet Ryan ZOXWR'U Date: 04/12/2020 Reason for consult: Mother's request;Difficult latch;Follow-up assessment;1st time breastfeeding;Term;Infant weight loss (infant -1% weight loss, mom has flat nipples that invert inward when stimulated, mom been given hand pump to pre-pump breast prior to latching infant.) Age:22 hours  Per mom, infant had 10 stools unsure of voids, LC mention diaper will turn blue their is urine indicator and encourage mom to record any stools or voids on yellow sheet. Mom fitted with 20 mm NS due to having flat nipples,Mom latched infant on her left breast using the football hold position, infant is sustain latch and breastfeeding with depth using the 20 mm NS, infant was still BF after 20 minutes when LC left the room. Mom will pump every 3 hours for 15 minutes on initial setting due to using 20 mm NS and to help establish mom's milk supply. Mom shown how to use DEBP & how to disassemble, clean, & reassemble parts.Mom knows to call RN or LC if she needs any further assistance with latching infant at the breast. Mom will give infant back any EBM that she pumped after she latches infant at the breast.  Per mom, she feels all questions and concerns has been addressed by LC at this time.  Maternal Data    Feeding Mother's Current Feeding Choice: Breast Milk  LATCH Score Latch: Grasps breast easily, tongue down, lips flanged, rhythmical sucking. (With 20 mm NS infant is sustain latch with depth.)  Audible Swallowing: Spontaneous and intermittent  Type of Nipple: Flat  Comfort (Breast/Nipple): Soft / non-tender  Hold (Positioning): Assistance needed to correctly position infant at breast and maintain latch.  LATCH Score: 8   Lactation Tools Discussed/Used Breast pump type: Double-Electric Breast Pump Pump Education: Setup, frequency, and cleaning;Milk Storage Reason for  Pumping: Mom has flat nipples that invert when stimulated and now using 20 mm NS  Interventions Interventions: Assisted with latch;Skin to skin;Pre-pump if needed;Breast compression;Adjust position;Support pillows;Position options;Expressed milk;Hand pump;DEBP;Education  Discharge    Consult Status Consult Status: Follow-up Date: 04/13/20 Follow-up type: In-patient    Danelle Earthly 04/12/2020, 4:45 PM

## 2020-04-12 NOTE — Lactation Note (Signed)
This note was copied from a baby's chart. Lactation Consultation Note  Patient Name: Janet Ryan LSLHT'D Date: 04/12/2020   Age:22 hours  LC received call from RN, Mom needed help with latching. On arrival, RN had infant in the bassinet doing a diaper change. Mom stated she was able to latch infant with 20 NS for 15 minutes on her own and did not need assistance at this time.  Maternal Data    Feeding    LATCH Score                    Lactation Tools Discussed/Used    Interventions    Discharge    Consult Status      [name]  Nicholson-Springer 04/12/2020, 9:12 PM

## 2020-04-12 NOTE — Progress Notes (Signed)
POSTPARTUM PROGRESS NOTE  Post Partum Day 1  Subjective:  Janet Ryan is a 20 y.o. G2P1011 s/p NSVD at [redacted]w[redacted]d.  She reports she is doing well. No acute events overnight. She denies any problems with ambulating, voiding or po intake. Denies nausea or vomiting.  Pain is moderately controlled.  Lochia is moderate "like a heavy period".  Objective: Blood pressure (!) 92/51, pulse 65, temperature 98.4 F (36.9 C), temperature source Oral, resp. rate 18, height 5\' 7"  (1.702 m), weight 84.2 kg, SpO2 99 %, unknown if currently breastfeeding.  Physical Exam:  General: alert, cooperative and no distress Chest: no respiratory distress Heart:regular rate, distal pulses intact Abdomen: soft, nontender,  Uterine Fundus: firm, appropriately tender DVT Evaluation: No calf swelling or tenderness Extremities: no edema Skin: warm, dry  Recent Labs    04/11/20 0041  HGB 12.6  HCT 36.3    Assessment/Plan: Janet Ryan is a 21 y.o. G2P1011 s/p NSVD at [redacted]w[redacted]d   PPD#1 - Doing well  Routine postpartum care  Contraception: POPs? Feeding: breast  Dispo: Plan for discharge tomorrow. Late transfer of prenatal care: Social work consult and postpartum. Fetal right pyeloectasis: Peds has been notified  LOS: 1 day   [redacted]w[redacted]d, MD  PGY-2, [facility]  04/12/2020, 7:00 AM

## 2020-04-12 NOTE — Anesthesia Postprocedure Evaluation (Signed)
Anesthesia Post Note  Patient: Janet Ryan  Procedure(s) Performed: AN AD HOC LABOR EPIDURAL     Patient location during evaluation: Mother Baby Anesthesia Type: Epidural Level of consciousness: awake Pain management: satisfactory to patient Vital Signs Assessment: post-procedure vital signs reviewed and stable Respiratory status: spontaneous breathing Cardiovascular status: stable Anesthetic complications: no   No complications documented.  Last Vitals:  Vitals:   04/12/20 0240 04/12/20 0604  BP: 116/81 (!) 92/51  Pulse: 79 65  Resp: 16 18  Temp: 37 C 36.9 C  SpO2: 99%     Last Pain:  Vitals:   04/12/20 0604  TempSrc: Oral  PainSc: 1    Pain Goal: Patients Stated Pain Goal: 0 (04/11/20 0741)                 Cephus Shelling

## 2020-04-13 MED ORDER — ACETAMINOPHEN 325 MG PO TABS: 1000.0000 mg | ORAL_TABLET | Freq: Four times a day (QID) | ORAL | Status: AC | PRN

## 2020-04-13 MED ORDER — COCONUT OIL OIL: 1.0000 "application " | TOPICAL_OIL | 0 refills | Status: AC | PRN

## 2020-04-13 MED ORDER — IBUPROFEN 600 MG PO TABS: 600.0000 mg | ORAL_TABLET | Freq: Four times a day (QID) | ORAL | Status: AC | PRN

## 2020-04-13 NOTE — Discharge Instructions (Signed)

## 2020-04-13 NOTE — Lactation Note (Signed)
This note was copied from a baby's chart. Lactation Consultation Note  Patient Name: Girl Enda Santo RSWNI'O Date: 04/13/2020 Reason for consult: Follow-up assessment Age:22 hours   P1 mother whose infant is now 48 hours old.  This is a term baby at 40+1 weeks.  Baby was asleep STS on mother's chest when I arrived.  Mother had some questions which I answered to her satisfaction; also discussed pumping and how to donate milk per mother's request.  Baby has been breast feeding with LATCH scores of 7-8.  Mother feels like she is making progress with breast feeding.  Engorgement prevention/treatment reviewed.  Mother has a manual pump and a DEBP for home use.  She has our OP phone number for any questions after discharge.  Pediatrician in room at the end of my visit; informed her that mother was questioning the [place] that will be done for baby.      Maternal Data    Feeding    LATCH Score                    Lactation Tools Discussed/Used    Interventions Interventions: Education  Discharge Discharge Education: Engorgement and breast care  Consult Status Consult Status: Complete Date: 04/13/20 Follow-up type: Call as needed    Tearah Saulsbury R Custer Pimenta 04/13/2020, 9:28 AM

## 2020-04-14 ENCOUNTER — Other Ambulatory Visit: Payer: Medicaid Other

## 2020-04-14 NOTE — Social Work (Signed)
LATE ENTRY  CSW received and acknowledges consult for EDPS of 9.  Consult screened out due to 9 on EDPS does not warrant a CSW consult.  MOB whom scores are greater than 9/yes to question 10 on Edinburgh Postpartum Depression Screen warrants a CSW consult.   Manfred Arch, MSW, [employer] Clinical Social Work [employer] and [employer] [phone]

## 2020-04-27 ENCOUNTER — Other Ambulatory Visit: Payer: Self-pay

## 2020-04-27 ENCOUNTER — Ambulatory Visit (INDEPENDENT_AMBULATORY_CARE_PROVIDER_SITE_OTHER): Payer: Medicaid Other

## 2020-04-27 VITALS — BP 122/76 | HR 73 | Wt 159.0 lb

## 2020-04-27 DIAGNOSIS — R8290 Unspecified abnormal findings in urine: Secondary | ICD-10-CM | POA: Diagnosis not present

## 2020-04-27 LAB — POCT URINALYSIS DIPSTICK

## 2020-04-27 NOTE — Progress Notes (Signed)
Patient was assessed and managed by nursing staff during this encounter. I have reviewed the chart and agree with the documentation and plan. I have also made any necessary editorial changes.  Jaynie Collins, MD 04/27/2020 4:33 PM

## 2020-04-27 NOTE — Progress Notes (Signed)
SUBJECTIVE: Janet Ryan is a 20 y.o. female who complains of cloudy urine for 3 days, without flank pain, fever, chills, or abnormal vaginal discharge or bleeding.   OBJECTIVE: Appears well, in no apparent distress.  Vital signs are normal. Urine dipstick shows positive for leukocytes, and moderate amount of blood.    ASSESSMENT: Cloudy Urine   PLAN: Treatment per orders.  Call or return to clinic prn if these symptoms worsen or fail to improve as anticipated.

## 2020-04-30 LAB — URINE CULTURE

## 2020-05-11 ENCOUNTER — Ambulatory Visit: Payer: Medicaid Other | Admitting: Obstetrics & Gynecology

## 2020-05-16 ENCOUNTER — Other Ambulatory Visit: Payer: Self-pay

## 2020-05-16 ENCOUNTER — Encounter: Payer: Self-pay | Admitting: Obstetrics & Gynecology

## 2020-05-16 ENCOUNTER — Telehealth (INDEPENDENT_AMBULATORY_CARE_PROVIDER_SITE_OTHER): Payer: Medicaid Other | Admitting: Obstetrics & Gynecology

## 2020-05-16 NOTE — Progress Notes (Signed)
   Provider location: [facility] for [facility]   Patient location: Home  I connected with Susanne Borders on 05/16/20 at  3:00 PM EDT by Mychart Video Encounter and verified that I am speaking with the correct person using two identifiers.       The purpose of this virtual visit is to provide medical care while limiting exposure to the novel coronavirus. I discussed the limitations, risks, security and privacy concerns of performing an evaluation and management service by video and the availability of in person appointments. I also discussed with the patient that there may be a patient responsible charge related to this service. The patient expressed understanding and agreed to proceed.  Post Partum Visit Note Subjective:   Janeane Cozart is a 20 y.o. G24P1011 female being evaluated for postpartum followup.  She is 5 weeks postpartum following a normal spontaneous vaginal delivery at  40.1 gestational weeks.  I have fully reviewed the prenatal and intrapartum course; pregnancy was uncomplicated.  Postpartum course has been uncomplicated. Baby is doing well. Baby is feeding by breast. Bleeding had slowed down, and now is unsure if she is on cycle. Bowel function is normal. Bladder function is normal. Patient is not sexually active. Contraception method is none. Postpartum depression screening: negative.  The pregnancy intention screening data noted above was reviewed. Potential methods of contraception were discussed. The patient elected to proceed with Female Condom.   The following portions of the patient's history were reviewed and updated as appropriate: allergies, current medications, past family history, past medical history, past social history, past surgical history and problem list.  Review of Systems Pertinent items noted in HPI and remainder of comprehensive ROS otherwise negative.   Objective:   General:  Alert, oriented and cooperative. Patient is in no acute  distress.  Respiratory: Normal respiratory effort, no problems with respiration noted  Mental Status: Normal mood and affect. Normal behavior. Normal judgment and thought content.  Rest of physical exam deferred due to type of encounter  Assessment:   Normal postpartum exam.  Plan:  Essential components of care per ACOG recommendations:  1.  Mood and well being: Patient with negative depression screening today. Reviewed local resources for support.  - Patient does not use tobacco or any drug use  2. Infant care and feeding:  -Patient currently breastmilk feeding? Yes . Reviewed importance of draining breast regularly to support lactation. -Social determinants of health (SDOH) reviewed in EPIC. No concerns.  3. Sexuality, contraception and birth spacing - Patient does not want a pregnancy in the next year.  Desired family size is unsure for now.  - Reviewed forms of contraception in tiered fashion. Patient desired condoms today.   - Discussed birth spacing of 18 months  4. Sleep and fatigue -Encouraged family/partner/community support of 4 hrs of uninterrupted sleep to help with mood and fatigue  5. Physical Recovery  - Discussed patients delivery, no complications - Patient had a second degree laceration, perineal healing reviewed. Patient expressed understanding - Patient has urinary incontinence? Yes - Patient is safe to resume physical and sexual activity  6.  Health Maintenance - Last pap smear done 09/2019 and was normal with negative HPV.  22 minutes of face-to-face time spent with the patient    Jaynie Collins, MD [facility] for [facility], [facility]

## 2021-08-08 IMAGING — US US MFM OB FOLLOW-UP
1 series · 13 of 28 positions shown · non-contrast
Comparison: none

[Series 1: us mfm ob follow-up · 71 acquisitions, 13 frames shown]
[im 3/71]
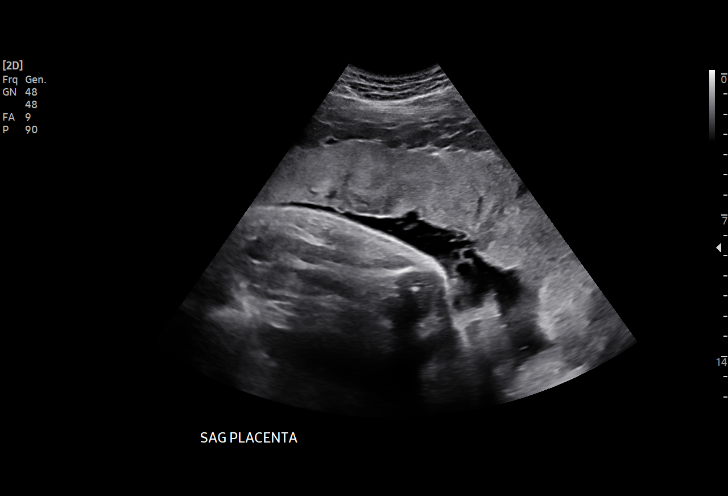
[im 8/71]
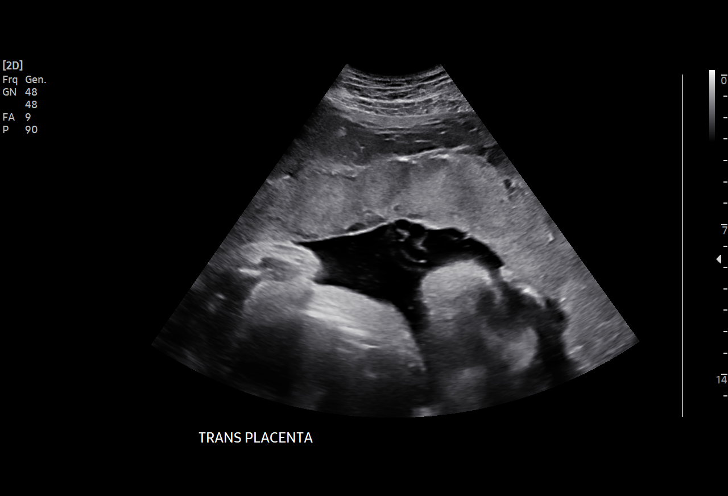
[im 13/71]
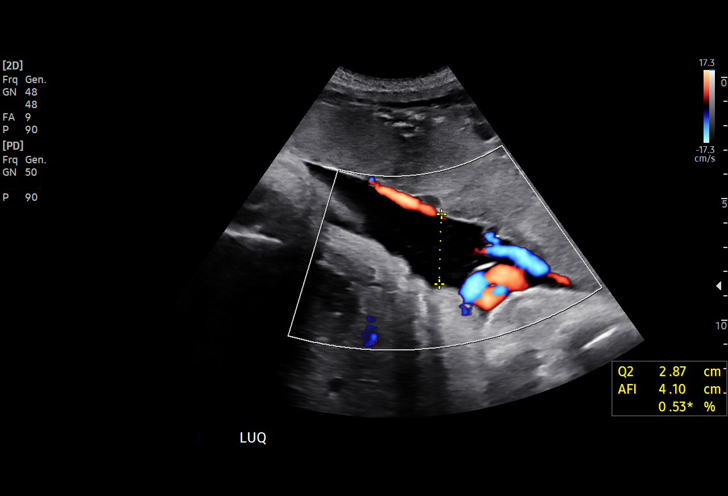
[im 19/71]
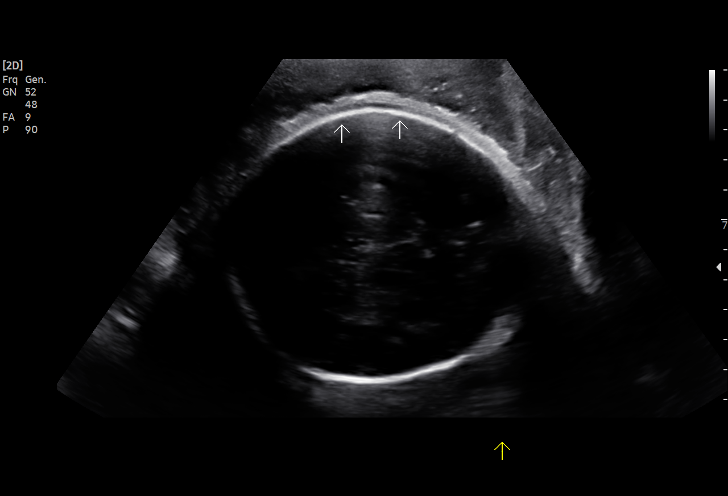
[im 24/71]
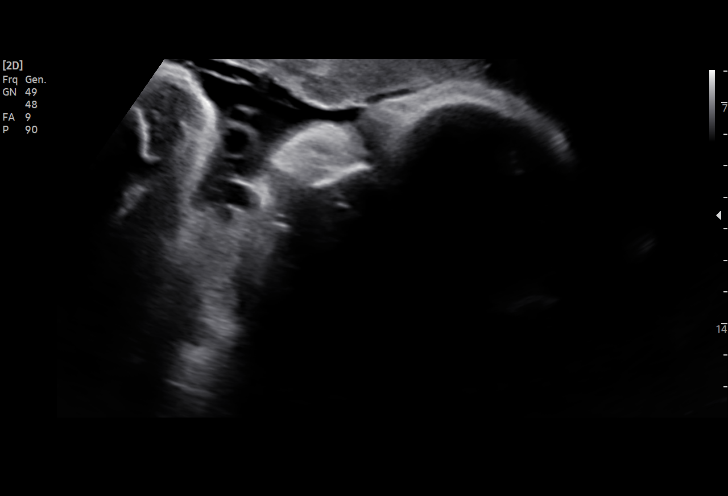
[im 29/71]
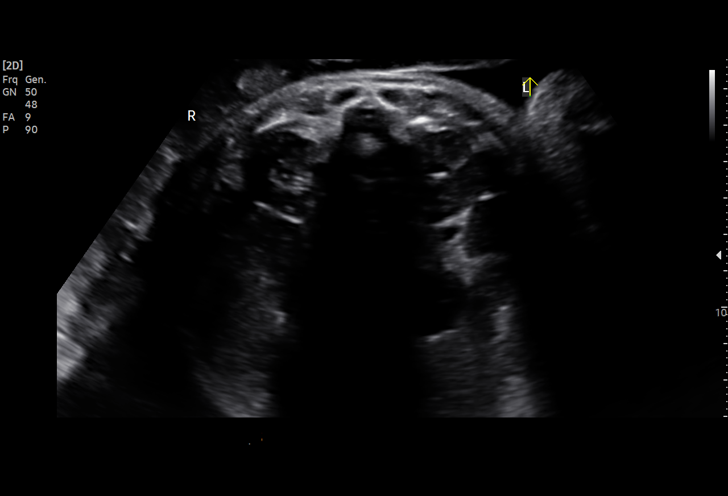
[im 37/71]
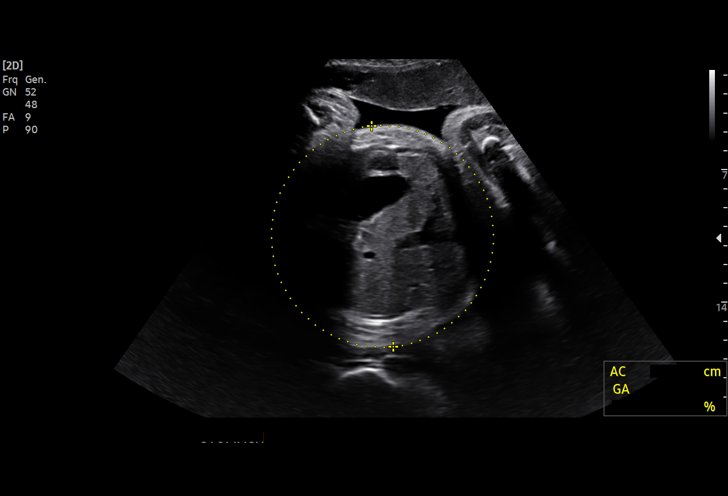
[im 42/71]
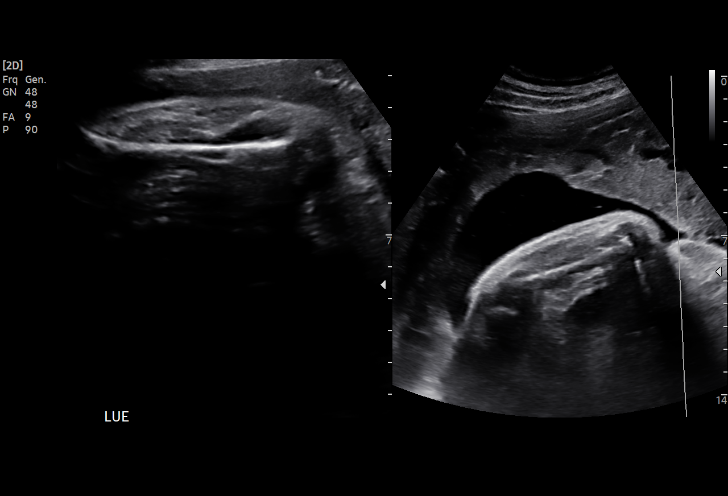
[im 47/71]
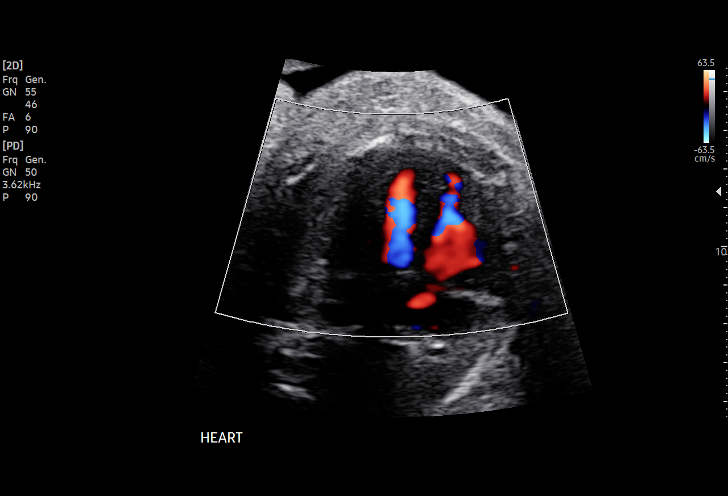
[im 52/71]
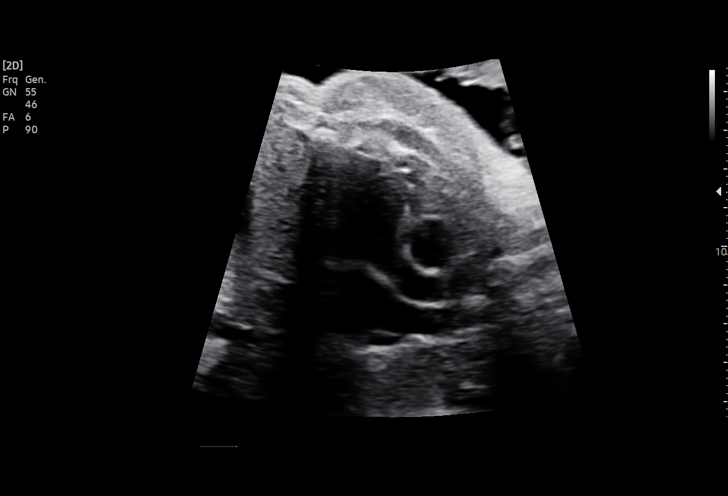
[im 58/71]
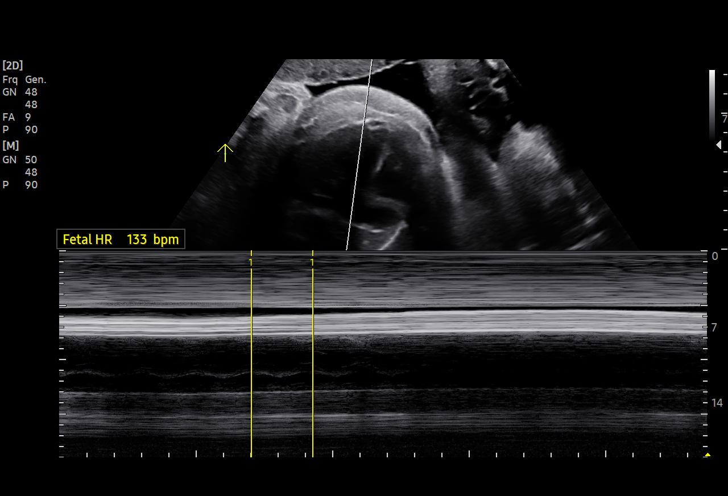
[im 63/71]
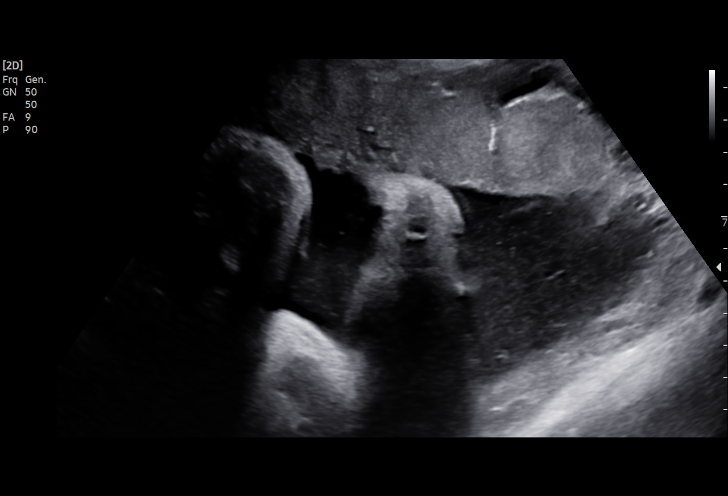
[im 68/71]
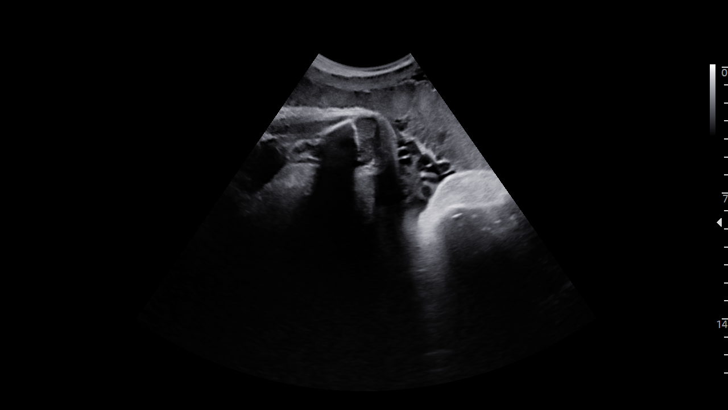

[13 of 28 positions shown; findings below may reference images not displayed]

[facility]]
                   PUSPO CNM

Indications

 Medical complication of pregnancy
 (Syncope, asthma)
 37 weeks gestation of pregnancy
 Encounter for antenatal screening for
 malformations
Fetal Evaluation

 Num Of Fetuses:         1
 Fetal Heart Rate(bpm):  133
 Cardiac Activity:       Observed
 Presentation:           Cephalic
 Placenta:               Anterior
 P. Cord Insertion:      Visualized, central

 Amniotic Fluid
 AFI FV:      Within normal limits

 AFI Sum(cm)     %Tile       Largest Pocket(cm)
 9.64            22

 RUQ(cm)       RLQ(cm)       LUQ(cm)        LLQ(cm)

Biometry

 BPD:      89.5  mm     G. Age:  36w 2d         32  %    CI:        75.55   %    70 - 86
                                                         FL/HC:      21.7   %    20.9 -
 HC:      326.5  mm     G. Age:  37w 0d         17  %    HC/AC:      0.91        0.92 -
 AC:      359.4  mm     G. Age:  39w 6d         98  %    FL/BPD:     79.2   %    71 - 87
 FL:       70.9  mm     G. Age:  36w 2d         21  %    FL/AC:      19.7   %    20 - 24
 HUM:      66.5  mm     G. Age:  38w 4d         94  %
 LV:          3  mm

 Est. FW:    2424  gm    7 lb 10 oz      78  %
OB History

 Gravidity:    2
 Living:       0
Gestational Age

 Clinical EDD:  37w 4d                                        EDD:   04/10/20
 U/S Today:     37w 3d                                        EDD:   04/11/20
 Best:          37w 4d     Det. By:  Clinical EDD             EDD:   04/10/20
Anatomy

 Cranium:               Appears normal         LVOT:                   Appears normal
 Cavum:                 Appears normal         Aortic Arch:            Appears normal
 Ventricles:            Appears normal         Ductal Arch:            Appears normal
 Choroid Plexus:        Appears normal         Diaphragm:              Appears normal
 Cerebellum:            Appears normal         Stomach:                Appears normal, left
                                                                       sided
 Posterior Fossa:       Appears normal         Abdomen:                Appears normal
 Nuchal Fold:           Not applicable (>20    Abdominal Wall:         Not well visualized
                        wks GA)
 Face:                  Orbits nl; profile not Cord Vessels:           Appears normal (3
                        well visualized                                vessel cord)
 Lips:                  Not well visualized    Kidneys:                Right sided
                                                                       pyelectasis,
                                                                       1.1mm
 Palate:                Not well visualized    Bladder:                Appears normal
 Thoracic:              Appears normal         Spine:                  Ltd views no
                                                                       intracranial signs of
                                                                       NTD
 Heart:                 Appears normal         Upper Extremities:      Visualized
                        (4CH, axis, and
                        situs)
 RVOT:                  Appears normal         Lower Extremities:      Visualized

 Other:  Technically difficult due to advanced GA and fetal position.
Cervix Uterus Adnexa

 Cervix
 Not visualized (advanced GA >82wks)

 Uterus
 No abnormality visualized.

 Right Ovary
 Not visualized.

 Left Ovary
 Not visualized.
 Cul De Sac
 No free fluid seen.

 Adnexa
 No adnexal mass visualized.
Comments

 This patient was seen for a follow up growth scan due to right-
 sided pyelectasis that was noted during her prior ultrasound
 exam.  She denies any problems since her last exam.
 She was informed that the fetal growth and amniotic fluid
 level appears appropriate for her gestational age.
 Right pyelectasis measuring 1.0 to 1.1 cm continues to be
 noted on today's exam.  The left fetal kidney appeared within
 normal limits.  The patient was advised to notify her
 pediatrician after birth regarding the pyelectasis that was
 noted during her prenatal ultrasounds.  Her pediatrician will
 order additional imaging studies of the infant's kidneys if
 necessary.
 Follow-up as indicated.

## 2021-11-01 IMAGING — US US MFM OB DETAIL+14 WK
1 series · 13 of 28 positions shown · non-contrast
Comparison: none

[Series 1: us mfm ob detail+14 wk · 13 of 92 slices shown]
[im 4/92]
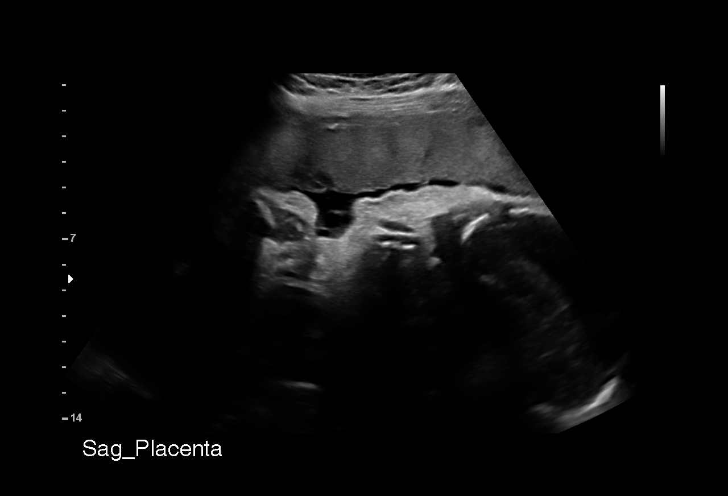
[im 11/92]
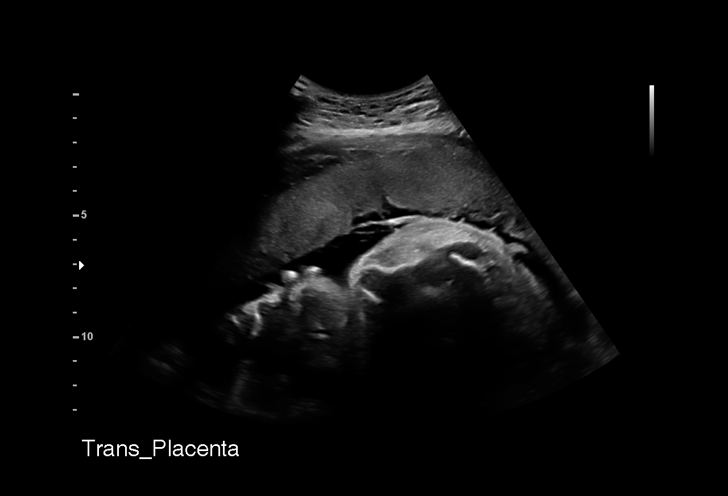
[im 17/92]
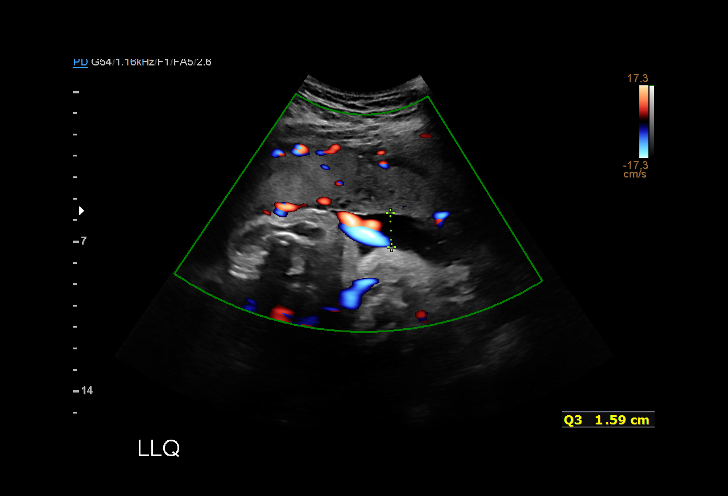
[im 24/92]
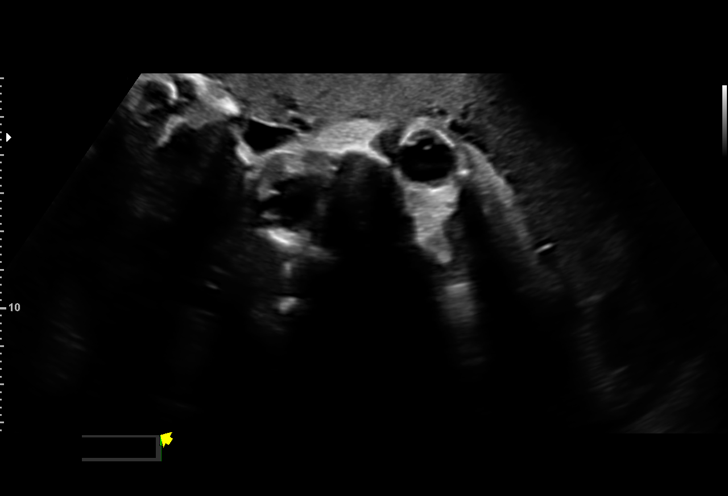
[im 31/92]
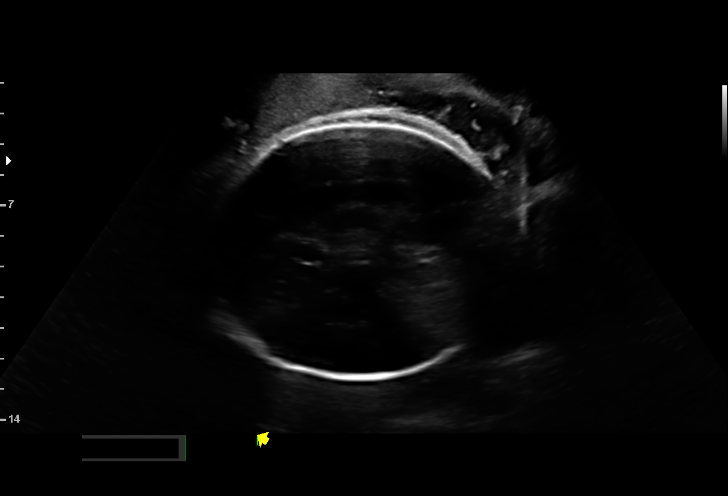
[im 38/92]
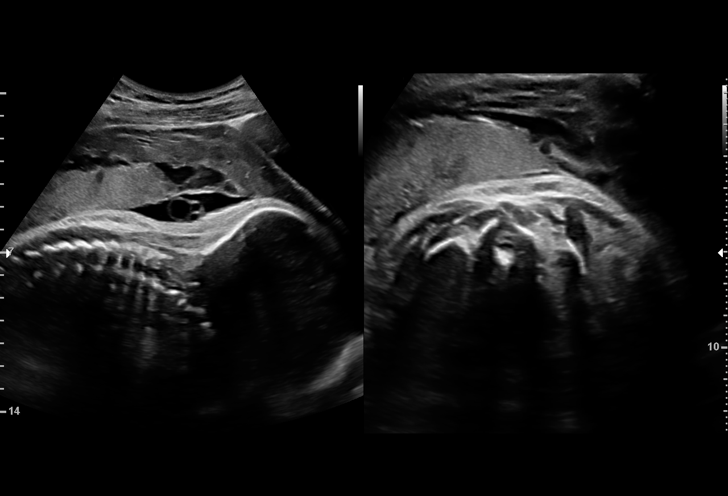
[im 48/92]
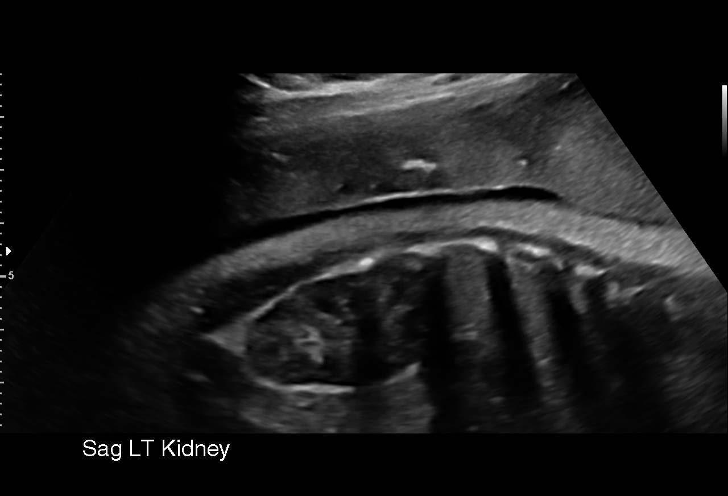
[im 54/92]
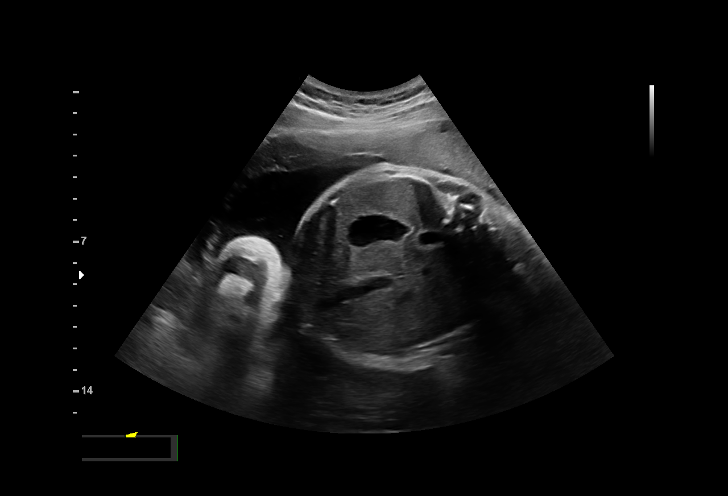
[im 61/92]
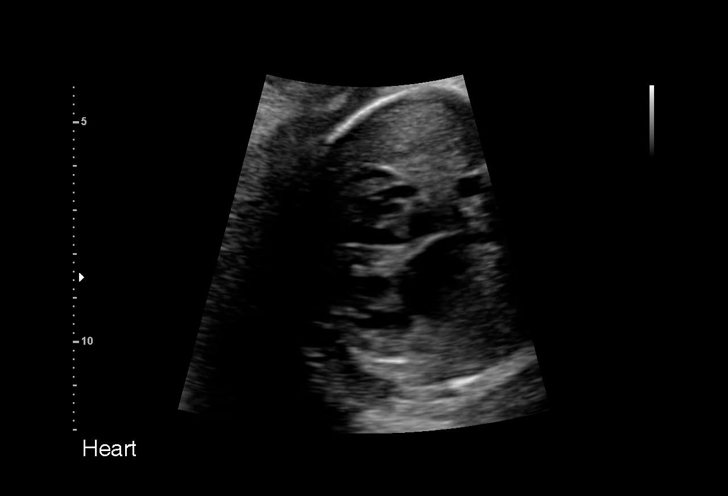
[im 68/92]
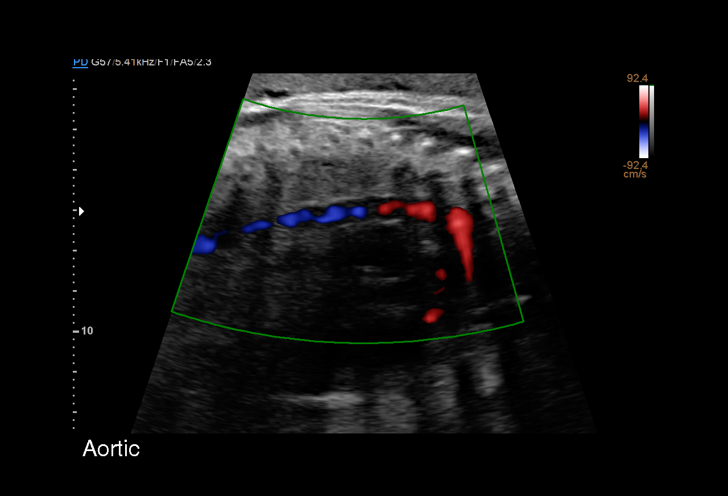
[im 75/92]
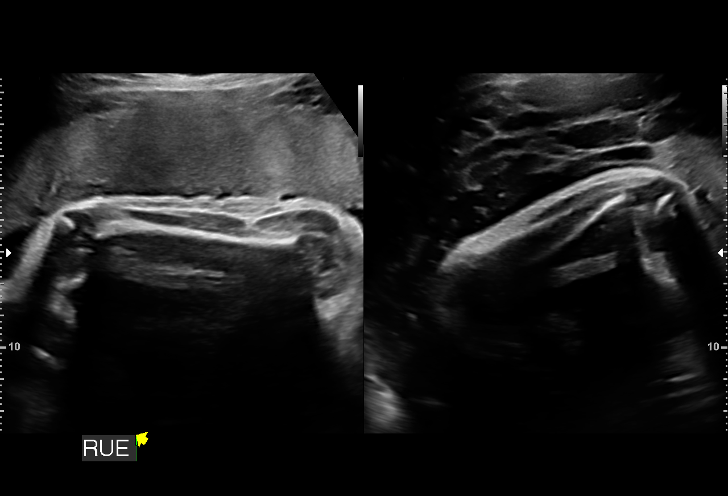
[im 81/92]
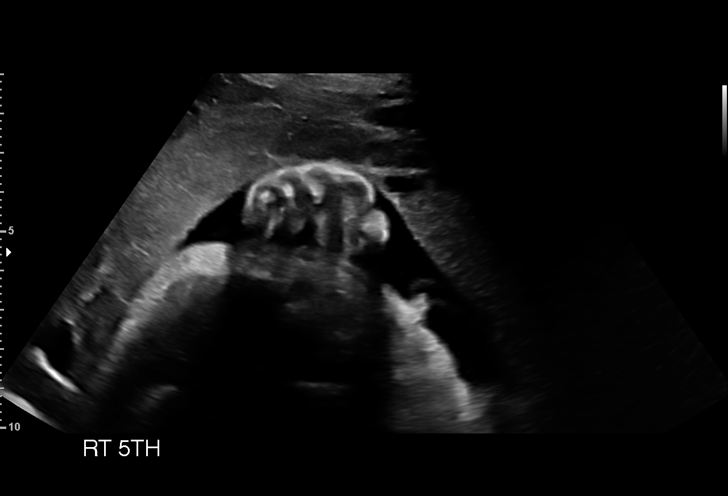
[im 88/92]
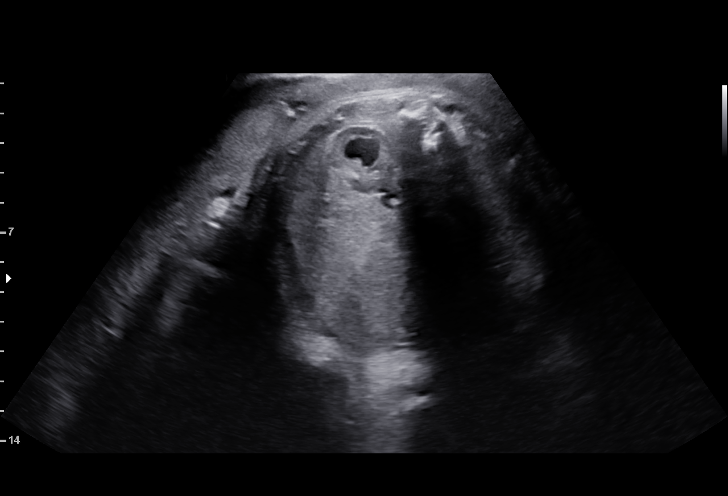

[13 of 28 positions shown; findings below may reference images not displayed]

[facility]]
                   HART CNM

Indications

 Medical complication of pregnancy
 (Syncope, asthma)
 33 weeks gestation of pregnancy
 Encounter for antenatal screening for
 malformations
Fetal Evaluation

 Num Of Fetuses:         1
 Fetal Heart Rate(bpm):  157
 Cardiac Activity:       Observed
 Presentation:           Cephalic
 Placenta:               Anterior
 P. Cord Insertion:      Visualized, central

 Amniotic Fluid
 AFI FV:      Within normal limits

 AFI Sum(cm)     %Tile       Largest Pocket(cm)
 12.08           34

 RUQ(cm)       RLQ(cm)       LUQ(cm)        LLQ(cm)

Biometry

 BPD:      80.8  mm     G. Age:  32w 3d         16  %    CI:        72.85   %    70 - 86
                                                         FL/HC:      22.8   %    19.4 -
 HC:       301   mm     G. Age:  33w 3d         13  %    HC/AC:      0.95        0.96 -
 AC:      317.3  mm     G. Age:  35w 5d         95  %    FL/BPD:     84.9   %    71 - 87
 FL:       68.6  mm     G. Age:  35w 2d         81  %    FL/AC:      21.6   %    20 - 24
 HUM:        58  mm     G. Age:  33w 4d         60  %
 CER:      37.9  mm     G. Age:  31w 1d        4.1  %

 LV:        1.7  mm
 CM:        5.6  mm

 Est. FW:    9444  gm    5 lb 10 oz      82  %
OB History

 Gravidity:    2
 Living:       0
Gestational Age

 Clinical EDD:  33w 4d                                        EDD:   04/10/20
 U/S Today:     34w 2d                                        EDD:   04/05/20
 Best:          33w 4d     Det. By:  Clinical EDD             EDD:   04/10/20
Anatomy

 Cranium:               Appears normal         LVOT:                   Not well visualized
 Cavum:                 Appears normal         Aortic Arch:            Appears normal
 Ventricles:            Appears normal         Ductal Arch:            Appears normal
 Choroid Plexus:        Appears normal         Diaphragm:              Appears normal
 Cerebellum:            Appears normal         Stomach:                Appears normal, left
                                                                       sided
 Posterior Fossa:       Appears normal         Abdomen:                Appears normal
 Nuchal Fold:           Not applicable (>20    Abdominal Wall:         Not well visualized
                        wks GA)
 Face:                  Orbits nl; profile not Cord Vessels:           Appears normal (3
                        well visualized                                vessel cord)
 Lips:                  Not well visualized    Kidneys:                Right sided
                                                                       pyelectasis,
                                                                       mm
 Palate:                Not well visualized    Bladder:                Appears normal
 Thoracic:              Appears normal         Spine:                  Ltd views no
                                                                       intracranial signs of
                                                                       NTD
 Heart:                 Not well visualized    Upper Extremities:      RUE Vis, LUE NWV
 RVOT:                  Appears normal         Lower Extremities:      RLE vis, LLE NWV

 Other:  Technically difficult due to advanced GA and fetal position.
Cervix Uterus Adnexa

 Cervix
 Not visualized (advanced GA >96wks)

 Uterus
 No abnormality visualized.

 Right Ovary
 Not visualized.

 Left Ovary
 Not visualized.
 Cul De Sac
 No free fluid seen.

 Adnexa
 No adnexal mass visualized.
Comments

 This patient was seen for a detailed fetal anatomy scan as
 she recently transferred her care to another institution for
 delivery at [HOSPITAL].  She denies any problems in her
 current pregnancy and denies any significant past medical
 history.
 She had a cell free DNA test earlier in her pregnancy which
 indicated a low risk for trisomy 21, 18, and 13. A female fetus
 is predicted.
 She was informed that the fetal growth and amniotic fluid
 level were appropriate for her gestational age.
 Mild right pyelectasis measuring 0.7 cm was noted today.
 The patient reports that pyelectasis was noted during her
 earlier ultrasounds.  She was reassured that the mild right
 pyelectasis noted today is most likely a normal variant.  Mild
 pyelectasis noted during prenatal ultrasounds often resolve
 after birth.  She was advised that her baby will require further
 imaging studies of the kidneys after delivery to determine if
 any treatment is necessary.  She was advised to notify her
 pediatrician regarding the pyelectasis that was noted during
 her prenatal ultrasounds.
 The views of the fetal anatomy were limited today due to her
 advanced gestational age.
 The patient was informed that anomalies may be missed due
 to technical limitations. If the fetus is in a suboptimal position
 or maternal habitus is increased, visualization of the fetus in
 the maternal uterus may be impaired.
 A follow-up exam was scheduled in 4 weeks to assess the
 fetal growth and to assess the fetal kidneys.
